# Patient Record
Sex: Male | Born: 1990
Health system: Southern US, Community
[De-identification: ages and names within clinical notes are randomized; demographics above are authoritative.]

## PROBLEM LIST (undated history)

## (undated) DIAGNOSIS — L02612 Cutaneous abscess of left foot: Secondary | ICD-10-CM

## (undated) DIAGNOSIS — F1910 Other psychoactive substance abuse, uncomplicated: Secondary | ICD-10-CM

## (undated) DIAGNOSIS — K759 Inflammatory liver disease, unspecified: Secondary | ICD-10-CM

## (undated) DIAGNOSIS — F1110 Opioid abuse, uncomplicated: Secondary | ICD-10-CM

## (undated) HISTORY — DX: Cutaneous abscess of left foot: L02.612

## (undated) HISTORY — PX: TONSILLECTOMY: SUR1361

---

## 2010-10-11 ENCOUNTER — Emergency Department (HOSPITAL_COMMUNITY)
Admission: EM | Admit: 2010-10-11 | Discharge: 2010-10-11 | Payer: Self-pay | Source: Home / Self Care | Admitting: Emergency Medicine

## 2011-10-03 ENCOUNTER — Encounter: Payer: Self-pay | Admitting: Emergency Medicine

## 2011-10-03 ENCOUNTER — Emergency Department (HOSPITAL_COMMUNITY)
Admission: EM | Admit: 2011-10-03 | Discharge: 2011-10-03 | Disposition: A | Discharge status: Home / Self Care | Payer: Self-pay | Attending: Emergency Medicine | Admitting: Emergency Medicine

## 2011-10-03 DIAGNOSIS — L02219 Cutaneous abscess of trunk, unspecified: Secondary | ICD-10-CM | POA: Insufficient documentation

## 2011-10-03 DIAGNOSIS — F17200 Nicotine dependence, unspecified, uncomplicated: Secondary | ICD-10-CM | POA: Insufficient documentation

## 2011-10-03 DIAGNOSIS — R222 Localized swelling, mass and lump, trunk: Secondary | ICD-10-CM | POA: Insufficient documentation

## 2011-10-03 DIAGNOSIS — R079 Chest pain, unspecified: Secondary | ICD-10-CM | POA: Insufficient documentation

## 2011-10-03 DIAGNOSIS — L02213 Cutaneous abscess of chest wall: Secondary | ICD-10-CM

## 2011-10-03 HISTORY — DX: Opioid abuse, uncomplicated: F11.10

## 2011-10-03 HISTORY — DX: Other psychoactive substance abuse, uncomplicated: F19.10

## 2011-10-03 MED ORDER — LIDOCAINE HCL 1 % IJ SOLN
INTRAMUSCULAR | Status: AC
  Filled 2011-10-03: qty 20

## 2011-10-03 MED ORDER — LIDOCAINE HCL (PF) 1 % IJ SOLN
20.0000 mL | Freq: Once | INTRAMUSCULAR | Status: AC
  Administered 2011-10-03: 20 mL via SUBCUTANEOUS

## 2011-10-03 MED ORDER — OXYCODONE-ACETAMINOPHEN 5-325 MG PO TABS
1.0000 | ORAL_TABLET | Freq: Once | ORAL | Status: AC
  Administered 2011-10-03: 1 via ORAL
  Filled 2011-10-03: qty 1

## 2011-10-03 MED ORDER — LIDOCAINE HCL (PF) 1 % IJ SOLN: 5.0000 mL | Freq: Once | INTRAMUSCULAR | Status: DC

## 2011-10-03 MED ORDER — HYDROCODONE-ACETAMINOPHEN 5-500 MG PO TABS: 1.0000 | ORAL_TABLET | Freq: Four times a day (QID) | ORAL | Status: AC | PRN

## 2011-10-03 MED ORDER — DOXYCYCLINE HYCLATE 100 MG PO CAPS: 100.0000 mg | ORAL_CAPSULE | Freq: Two times a day (BID) | ORAL | Status: AC

## 2011-10-03 NOTE — ED Provider Notes (Signed)
History     CSN: [id number] Arrival date & time: 10/03/2011  2:06 PM   First MD Initiated Contact with Patient 10/03/11 1620      Chief Complaint  Patient presents with  . Chest Pain  . Cyst     Patient is a 20 y.o. male presenting with chest pain and abscess. The history is provided by the patient.  Chest Pain Pertinent negatives for primary symptoms include no fever.    Abscess  This is a new problem. The current episode started less than one week ago. The onset was sudden. The problem occurs continuously. The problem is moderate. The abscess is characterized by painfulness, swelling and redness. The abscess first occurred at home. Pertinent negatives include no fever.  Reports onset of small re "bump" to center of upper chest wall 4 days ago. States she was seen at [facility] who did a CXR that just showed inflammation. Swelling, redness and pain have worsened.  Past Medical History  Diagnosis Date  . IV drug abuse   . Drug abuse, opioid type     Past Surgical History  Procedure Date  . Tonsillectomy     History reviewed. No pertinent family history.  History  Substance Use Topics  . Smoking status: Current Everyday Smoker -- 0.5 packs/day    Types: Cigarettes  . Smokeless tobacco: Not on file  . Alcohol Use: Yes      Review of Systems  Constitutional: Negative.  Negative for fever.  HENT: Negative.   Eyes: Negative.   Respiratory: Negative.   Cardiovascular: Positive for chest pain.  Gastrointestinal: Negative.   Genitourinary: Negative.   Musculoskeletal: Negative.   Skin: Negative.   Neurological: Negative.   Hematological: Negative.   Psychiatric/Behavioral: Negative.     Allergies  Review of patient's allergies indicates no known allergies.  Home Medications   Current Outpatient Rx  Name Route Sig Dispense Refill  . HYDROCODONE-ACETAMINOPHEN 5-325 MG PO TABS Oral Take 1 tablet by mouth every 6 (six) hours as needed. Chest pain     .  IBUPROFEN 200 MG PO TABS Oral Take 200 mg by mouth every 6 (six) hours as needed. Swelling and pain       BP 121/66  Pulse 96  Temp(Src) 98.5 F (36.9 C) (Oral)  Resp 16  SpO2 100%  Physical Exam  Constitutional: He appears well-developed and well-nourished.  HENT:  Head: Normocephalic and atraumatic.  Eyes: Conjunctivae are normal.  Neck: Neck supple.  Cardiovascular: Normal rate.   Pulmonary/Chest: Effort normal. He exhibits mass.         Approx 3 cm raised nodule to mid upper chest wall just above nipple line. Nodule is very erythematous and non-draining. There is an area of associated erythema at the periphery of the wound that measures approx 10-12 cm.   Musculoskeletal: Normal range of motion.  Neurological: He is alert.  Skin: Skin is warm and dry.  Psychiatric: He has a normal mood and affect.    ED Course  INCISION AND DRAINAGE Date/Time: 10/03/2011 6:04 PM Performed by: Leanne Chang Authorized by: Leanne Chang Consent: Verbal consent obtained. Risks and benefits: risks, benefits and alternatives were discussed Consent given by: patient Patient understanding: patient states understanding of the procedure being performed Required items: required blood products, implants, devices, and special equipment available Patient identity confirmed: verbally with patient and arm band Type: abscess Body area: trunk Anesthesia: local infiltration Local anesthetic: lidocaine 1% without epinephrine Anesthetic total: 3 ml Patient  sedated: no Scalpel size: 11 Incision type: single straight Complexity: simple Drainage: bloody Drainage amount: scant Wound treatment: drain placed Patient tolerance: Patient tolerated the procedure well with no immediate complications.  Impression dicsussed. Will d/c home on abx and encourage warm soaks and to return in 2 days for recheck. Pt agreeable w/ plan.   1. Abscess of chest wall       MDM  Chest wall  abscess.   Medical screening examination/treatment/procedure(s) were performed by non-physician practitioner and as supervising physician I was immediately available for consultation/collaboration. Osvaldo Human, M.D.      Leanne Chang, NP 10/03/11 1806  Carleene Cooper III, MD 10/04/11 [phone]

## 2011-10-03 NOTE — ED Notes (Signed)
Pt in c/o bump to sternal area of chest, states it was first noted 3 days ago and was a small red area, since that time has become larger, redness spreading down chest and causing pain in axillary area with movement, area is warm and tender to touch.

## 2011-10-03 NOTE — ED Notes (Signed)
Pt states had small area to center of chest upon waking, pt states it is getting bigger and more painful denies injury or bite. Denies drainage.

## 2011-10-06 ENCOUNTER — Emergency Department (HOSPITAL_COMMUNITY)
Admission: EM | Admit: 2011-10-06 | Discharge: 2011-10-06 | Disposition: A | Discharge status: Home / Self Care | Payer: Self-pay | Attending: Emergency Medicine | Admitting: Emergency Medicine

## 2011-10-06 ENCOUNTER — Encounter (HOSPITAL_COMMUNITY): Payer: Self-pay | Admitting: *Deleted

## 2011-10-06 DIAGNOSIS — Z09 Encounter for follow-up examination after completed treatment for conditions other than malignant neoplasm: Secondary | ICD-10-CM | POA: Insufficient documentation

## 2011-10-06 DIAGNOSIS — L0291 Cutaneous abscess, unspecified: Secondary | ICD-10-CM | POA: Insufficient documentation

## 2011-10-06 DIAGNOSIS — M436 Torticollis: Secondary | ICD-10-CM | POA: Insufficient documentation

## 2011-10-06 DIAGNOSIS — R079 Chest pain, unspecified: Secondary | ICD-10-CM | POA: Insufficient documentation

## 2011-10-06 DIAGNOSIS — L0390 Cellulitis, unspecified: Secondary | ICD-10-CM | POA: Insufficient documentation

## 2011-10-06 NOTE — ED Provider Notes (Signed)
History     CSN: [id number]  Arrival date & time 10/06/11  1321   First MD Initiated Contact with Patient 10/06/11 1514      Chief Complaint  Patient presents with  . Wound Check    pt has wound to chest from abscess drainage. pt reports increased drainage.     (Consider location/radiation/quality/duration/timing/severity/associated sxs/prior treatment) Patient is a 20 y.o. male presenting with abscess.  Abscess  This is a new problem. The problem has been gradually improving. The abscess is present on the torso. The problem is moderate. The abscess is characterized by redness, painfulness and draining. Pertinent negatives include no fever, no diarrhea, no vomiting, no congestion, no sore throat and no cough.   Patient seen the emergency partner 3 days ago with I&D of abscess had been present for several days after the I&D there was no expression of pus but it started draining spontaneously yesterday large amount of pus. Patient started taking his antibiotic doxycycline on Tuesday. Overall the patient says the abscess located in the substernal midportion of his chest is improving significantly less swelling less redness. It is still painful pain currently is about 4/10 worse with palpitation does not radiate that is improved from 10 out of 10 before the I&D.   Past Medical History  Diagnosis Date  . IV drug abuse   . Drug abuse, opioid type     Past Surgical History  Procedure Date  . Tonsillectomy     History reviewed. No pertinent family history.  History  Substance Use Topics  . Smoking status: Current Everyday Smoker -- 0.5 packs/day    Types: Cigarettes  . Smokeless tobacco: Not on file  . Alcohol Use: Yes      Review of Systems  Constitutional: Negative for fever.  HENT: Positive for neck stiffness. Negative for congestion, sore throat and neck pain.   Eyes: Negative for visual disturbance.  Respiratory: Positive for chest tightness. Negative for cough and  shortness of breath.   Cardiovascular: Positive for chest pain. Negative for leg swelling.  Gastrointestinal: Negative for vomiting, abdominal pain and diarrhea.  Genitourinary: Negative for dysuria.  Musculoskeletal: Negative for back pain.  Skin: Positive for wound. Negative for rash.  Neurological: Negative for headaches.  Hematological: Does not bruise/bleed easily.    Allergies  Review of patient's allergies indicates no known allergies.  Home Medications   Current Outpatient Rx  Name Route Sig Dispense Refill  . DOXYCYCLINE HYCLATE 100 MG PO CAPS Oral Take 1 capsule (100 mg total) by mouth 2 (two) times daily. 20 capsule 0  . HYDROCODONE-ACETAMINOPHEN 5-500 MG PO TABS Oral Take 1-2 tablets by mouth every 6 (six) hours as needed for pain. Every 4-6 hours as needed for pain 15 tablet 0  . IBUPROFEN 200 MG PO TABS Oral Take 200 mg by mouth every 6 (six) hours as needed. Swelling and pain       BP 128/82  Pulse 96  Temp(Src) 98.3 F (36.8 C) (Oral)  Resp 20  SpO2 100%  Physical Exam  Nursing note and vitals reviewed. Constitutional: He is oriented to person, place, and time. He appears well-developed and well-nourished.  HENT:  Head: Normocephalic and atraumatic.  Mouth/Throat: Oropharynx is clear and moist.  Eyes: Conjunctivae and EOM are normal. Pupils are equal, round, and reactive to light.  Neck: Normal range of motion. Neck supple.  Cardiovascular: Normal rate, regular rhythm and normal heart sounds.   No murmur heard. Pulmonary/Chest: Effort normal and breath sounds  normal.  Abdominal: Soft. Bowel sounds are normal. There is no tenderness.  Musculoskeletal: Normal range of motion.  Neurological: He is alert and oriented to person, place, and time. No cranial nerve deficit. He exhibits normal muscle tone. Coordination normal.  Skin: Skin is warm. There is erythema.       Draining abscess mid  Sternal area. 4 cm area of redness worsening area of induration draining  purulent material. Minimal swelling.     ED Course  Procedures (including critical care time)  Labs Reviewed - No data to display No results found.   1. Abscess       MDM   Midsternal skin abscess I&D on Monday, 3 days ago, initially according to know did not drain pus patient stated that the wound started draining pus yesterday. Continue drain today but less swelling is gone down redness is gone down. Examination of the wound shows still some expression of some purulent material of both overall based on initial description wound is improving. Patient's taking antibiotic doxycycline as directed. The wick that was placed in the wound fell out yesterday. Patient given further wound instructions were so wound daily with soap and water continue to dress return if the abscess gets worse at all she continued to improve slowly from this point on.        Shelda Jakes, MD 10/06/11 [phone]

## 2017-01-01 ENCOUNTER — Emergency Department (HOSPITAL_COMMUNITY): Payer: No Typology Code available for payment source

## 2017-01-01 ENCOUNTER — Encounter (HOSPITAL_COMMUNITY): Payer: Self-pay | Admitting: Emergency Medicine

## 2017-01-01 ENCOUNTER — Inpatient Hospital Stay (HOSPITAL_COMMUNITY)
Admission: EM | Admit: 2017-01-01 | Discharge: 2017-01-03 | DRG: 605 | Disposition: A | Discharge status: Home / Self Care | Payer: No Typology Code available for payment source | Attending: General Surgery | Admitting: General Surgery

## 2017-01-01 DIAGNOSIS — Z720 Tobacco use: Secondary | ICD-10-CM

## 2017-01-01 DIAGNOSIS — Y92009 Unspecified place in unspecified non-institutional (private) residence as the place of occurrence of the external cause: Secondary | ICD-10-CM

## 2017-01-01 DIAGNOSIS — R52 Pain, unspecified: Secondary | ICD-10-CM

## 2017-01-01 DIAGNOSIS — E860 Dehydration: Secondary | ICD-10-CM | POA: Diagnosis present

## 2017-01-01 DIAGNOSIS — F17210 Nicotine dependence, cigarettes, uncomplicated: Secondary | ICD-10-CM | POA: Diagnosis present

## 2017-01-01 DIAGNOSIS — E871 Hypo-osmolality and hyponatremia: Secondary | ICD-10-CM | POA: Diagnosis present

## 2017-01-01 DIAGNOSIS — D62 Acute posthemorrhagic anemia: Secondary | ICD-10-CM | POA: Diagnosis present

## 2017-01-01 DIAGNOSIS — S71112A Laceration without foreign body, left thigh, initial encounter: Secondary | ICD-10-CM | POA: Diagnosis not present

## 2017-01-01 DIAGNOSIS — Z79899 Other long term (current) drug therapy: Secondary | ICD-10-CM

## 2017-01-01 DIAGNOSIS — M79605 Pain in left leg: Secondary | ICD-10-CM | POA: Diagnosis not present

## 2017-01-01 DIAGNOSIS — F1910 Other psychoactive substance abuse, uncomplicated: Secondary | ICD-10-CM

## 2017-01-01 DIAGNOSIS — S7012XA Contusion of left thigh, initial encounter: Secondary | ICD-10-CM

## 2017-01-01 LAB — CBC WITH DIFFERENTIAL/PLATELET
BASOS ABS: 0 10*3/uL (ref 0.0–0.1)
BASOS PCT: 0 %
Eosinophils Absolute: 0.1 10*3/uL (ref 0.0–0.7)
Eosinophils Relative: 1 %
HEMATOCRIT: 34.3 % — AB (ref 39.0–52.0)
HEMOGLOBIN: 12 g/dL — AB (ref 13.0–17.0)
LYMPHS PCT: 17 %
Lymphs Abs: 1.8 10*3/uL (ref 0.7–4.0)
MCH: 30.5 pg (ref 26.0–34.0)
MCHC: 35 g/dL (ref 30.0–36.0)
MCV: 87.3 fL (ref 78.0–100.0)
Monocytes Absolute: 0.8 10*3/uL (ref 0.1–1.0)
Monocytes Relative: 7 %
NEUTROS ABS: 8.3 10*3/uL — AB (ref 1.7–7.7)
NEUTROS PCT: 75 %
PLATELETS: 233 10*3/uL (ref 150–400)
RBC: 3.93 MIL/uL — ABNORMAL LOW (ref 4.22–5.81)
RDW: 13.9 % (ref 11.5–15.5)
WBC: 11 10*3/uL — ABNORMAL HIGH (ref 4.0–10.5)

## 2017-01-01 LAB — BASIC METABOLIC PANEL
Anion gap: 9 (ref 5–15)
BUN: 17 mg/dL (ref 6–20)
CO2: 25 mmol/L (ref 22–32)
Calcium: 9 mg/dL (ref 8.9–10.3)
Chloride: 100 mmol/L — ABNORMAL LOW (ref 101–111)
Creatinine, Ser: 0.65 mg/dL (ref 0.61–1.24)
GFR calc Af Amer: >60 mL/min (ref >60)
GLUCOSE: 101 mg/dL — AB (ref 65–99)
Potassium: 3.5 mmol/L (ref 3.5–5.1)
SODIUM: 134 mmol/L — AB (ref 135–145)

## 2017-01-01 LAB — TYPE AND SCREEN
ABO/RH(D): B NEG
ANTIBODY SCREEN: NEGATIVE

## 2017-01-01 LAB — I-STAT CG4 LACTIC ACID, ED
LACTIC ACID, VENOUS: 0.58 mmol/L (ref 0.5–1.9)
Lactic Acid, Venous: 0.98 mmol/L (ref 0.5–1.9)

## 2017-01-01 MED ORDER — BISACODYL 10 MG RE SUPP
10.0000 mg | Freq: Two times a day (BID) | RECTAL | Status: DC | PRN
Start: 2017-01-01 — End: 2017-01-03

## 2017-01-01 MED ORDER — DIAZEPAM 5 MG PO TABS
5.0000 mg | ORAL_TABLET | Freq: Three times a day (TID) | ORAL | Status: DC | PRN
Start: 2017-01-01 — End: 2017-01-03

## 2017-01-01 MED ORDER — AMOXICILLIN-POT CLAVULANATE 875-125 MG PO TABS: 1.0000 | ORAL_TABLET | Freq: Two times a day (BID) | ORAL | 1 refills | Status: DC

## 2017-01-01 MED ORDER — MORPHINE SULFATE (PF) 4 MG/ML IV SOLN
4.0000 mg | Freq: Once | INTRAVENOUS | Status: AC
  Administered 2017-01-01: 4 mg via INTRAVENOUS
  Filled 2017-01-01: qty 1

## 2017-01-01 MED ORDER — HYDRALAZINE HCL 20 MG/ML IJ SOLN: 5.0000 mg | Freq: Four times a day (QID) | INTRAMUSCULAR | Status: DC | PRN

## 2017-01-01 MED ORDER — PIPERACILLIN-TAZOBACTAM 3.375 G IVPB 30 MIN
3.3750 g | Freq: Once | INTRAVENOUS | Status: AC
  Administered 2017-01-01: 3.375 g via INTRAVENOUS
  Filled 2017-01-01: qty 50

## 2017-01-01 MED ORDER — TETANUS-DIPHTH-ACELL PERTUSSIS 5-2.5-18.5 LF-MCG/0.5 IM SUSP
0.5000 mL | Freq: Once | INTRAMUSCULAR | Status: AC
  Administered 2017-01-01: 0.5 mL via INTRAMUSCULAR
  Filled 2017-01-01: qty 0.5

## 2017-01-01 MED ORDER — VANCOMYCIN HCL IN DEXTROSE 1-5 GM/200ML-% IV SOLN
1000.0000 mg | Freq: Three times a day (TID) | INTRAVENOUS | Status: DC
  Administered 2017-01-01 – 2017-01-03 (×5): 1000 mg via INTRAVENOUS
  Filled 2017-01-01 (×7): qty 200

## 2017-01-01 MED ORDER — PHENOL 1.4 % MT LIQD
2.0000 | OROMUCOSAL | Status: DC | PRN
  Filled 2017-01-01: qty 177

## 2017-01-01 MED ORDER — LIP MEDEX EX OINT
1.0000 "application " | TOPICAL_OINTMENT | Freq: Two times a day (BID) | CUTANEOUS | Status: DC
  Administered 2017-01-02 (×2): 1 via TOPICAL
  Filled 2017-01-01: qty 7

## 2017-01-01 MED ORDER — METOPROLOL TARTRATE 12.5 MG HALF TABLET: 12.5000 mg | ORAL_TABLET | Freq: Two times a day (BID) | ORAL | Status: DC | PRN

## 2017-01-01 MED ORDER — OXYCODONE HCL 5 MG PO TABS: 5.0000 mg | ORAL_TABLET | ORAL | Status: DC | PRN

## 2017-01-01 MED ORDER — METHOCARBAMOL 750 MG PO TABS: 750.0000 mg | ORAL_TABLET | Freq: Four times a day (QID) | ORAL | 2 refills | Status: DC | PRN

## 2017-01-01 MED ORDER — HYDROMORPHONE HCL 1 MG/ML IJ SOLN
1.0000 mg | Freq: Once | INTRAMUSCULAR | Status: AC
  Administered 2017-01-01: 1 mg via INTRAVENOUS
  Filled 2017-01-01: qty 1

## 2017-01-01 MED ORDER — IOPAMIDOL (ISOVUE-370) INJECTION 76%
INTRAVENOUS | Status: AC
  Administered 2017-01-01: 100 mL
  Filled 2017-01-01: qty 100

## 2017-01-01 MED ORDER — DIPHENHYDRAMINE HCL 50 MG/ML IJ SOLN: 12.5000 mg | Freq: Four times a day (QID) | INTRAMUSCULAR | Status: DC | PRN

## 2017-01-01 MED ORDER — LACTATED RINGERS IV BOLUS (SEPSIS): 1000.0000 mL | Freq: Three times a day (TID) | INTRAVENOUS | Status: DC | PRN

## 2017-01-01 MED ORDER — KCL IN DEXTROSE-NACL 40-5-0.45 MEQ/L-%-% IV SOLN
INTRAVENOUS | Status: DC
  Administered 2017-01-02 (×2): via INTRAVENOUS
  Filled 2017-01-01 (×5): qty 1000

## 2017-01-01 MED ORDER — SODIUM CHLORIDE 0.9 % IV BOLUS (SEPSIS)
1000.0000 mL | Freq: Once | INTRAVENOUS | Status: AC
  Administered 2017-01-01: 1000 mL via INTRAVENOUS

## 2017-01-01 MED ORDER — ACETAMINOPHEN 650 MG RE SUPP: 650.0000 mg | Freq: Four times a day (QID) | RECTAL | Status: DC | PRN

## 2017-01-01 MED ORDER — FENTANYL CITRATE (PF) 100 MCG/2ML IJ SOLN
25.0000 ug | INTRAMUSCULAR | Status: DC | PRN
  Administered 2017-01-02: 50 ug via INTRAVENOUS
  Filled 2017-01-01: qty 2

## 2017-01-01 MED ORDER — DIPHENHYDRAMINE HCL 25 MG PO CAPS: 25.0000 mg | ORAL_CAPSULE | Freq: Four times a day (QID) | ORAL | Status: DC | PRN

## 2017-01-01 MED ORDER — LIDOCAINE-EPINEPHRINE (PF) 2 %-1:200000 IJ SOLN
20.0000 mL | Freq: Once | INTRAMUSCULAR | Status: AC
  Administered 2017-01-01: 20 mL via INTRADERMAL
  Filled 2017-01-01: qty 20

## 2017-01-01 MED ORDER — PIPERACILLIN-TAZOBACTAM 3.375 G IVPB
3.3750 g | Freq: Three times a day (TID) | INTRAVENOUS | Status: DC
  Administered 2017-01-02 – 2017-01-03 (×4): 3.375 g via INTRAVENOUS
  Filled 2017-01-01 (×6): qty 50

## 2017-01-01 MED ORDER — MENTHOL 3 MG MT LOZG
1.0000 | LOZENGE | OROMUCOSAL | Status: DC | PRN
  Filled 2017-01-01: qty 9

## 2017-01-01 MED ORDER — NAPROXEN 500 MG PO TABS: 500.0000 mg | ORAL_TABLET | Freq: Two times a day (BID) | ORAL | 1 refills | Status: DC

## 2017-01-01 MED ORDER — ONDANSETRON HCL 4 MG/2ML IJ SOLN: 4.0000 mg | Freq: Four times a day (QID) | INTRAMUSCULAR | Status: DC | PRN

## 2017-01-01 MED ORDER — NICOTINE 21 MG/24HR TD PT24
21.0000 mg | MEDICATED_PATCH | Freq: Once | TRANSDERMAL | Status: AC
  Administered 2017-01-01: 21 mg via TRANSDERMAL
  Filled 2017-01-01: qty 1

## 2017-01-01 MED ORDER — METHOCARBAMOL 500 MG PO TABS
1000.0000 mg | ORAL_TABLET | Freq: Four times a day (QID) | ORAL | Status: DC
  Administered 2017-01-02 – 2017-01-03 (×6): 1000 mg via ORAL
  Filled 2017-01-01 (×6): qty 2

## 2017-01-01 MED ORDER — METOPROLOL TARTRATE 5 MG/5ML IV SOLN: 5.0000 mg | Freq: Four times a day (QID) | INTRAVENOUS | Status: DC | PRN

## 2017-01-01 MED ORDER — NAPROXEN 250 MG PO TABS
500.0000 mg | ORAL_TABLET | Freq: Two times a day (BID) | ORAL | Status: DC
  Administered 2017-01-02 – 2017-01-03 (×3): 500 mg via ORAL
  Filled 2017-01-01 (×3): qty 2

## 2017-01-01 MED ORDER — LACTATED RINGERS IV SOLN: INTRAVENOUS | Status: DC

## 2017-01-01 MED ORDER — ALUM & MAG HYDROXIDE-SIMETH 200-200-20 MG/5ML PO SUSP: 30.0000 mL | Freq: Four times a day (QID) | ORAL | Status: DC | PRN

## 2017-01-01 MED ORDER — PROCHLORPERAZINE EDISYLATE 5 MG/ML IJ SOLN: 5.0000 mg | INTRAMUSCULAR | Status: DC | PRN

## 2017-01-01 MED ORDER — ACETAMINOPHEN 325 MG PO TABS: 325.0000 mg | ORAL_TABLET | Freq: Four times a day (QID) | ORAL | Status: DC | PRN

## 2017-01-01 MED ORDER — MAGIC MOUTHWASH
15.0000 mL | Freq: Four times a day (QID) | ORAL | Status: DC | PRN
  Filled 2017-01-01: qty 15

## 2017-01-01 MED ORDER — SODIUM CHLORIDE 0.9 % IV SOLN
8.0000 mg | Freq: Four times a day (QID) | INTRAVENOUS | Status: DC | PRN
  Filled 2017-01-01: qty 4

## 2017-01-01 MED ORDER — POLYETHYLENE GLYCOL 3350 17 G PO PACK
17.0000 g | PACK | Freq: Two times a day (BID) | ORAL | Status: DC | PRN
  Administered 2017-01-02: 17 g via ORAL
  Filled 2017-01-01: qty 1

## 2017-01-01 MED ORDER — ONDANSETRON HCL 4 MG/2ML IJ SOLN
4.0000 mg | Freq: Once | INTRAMUSCULAR | Status: AC
Start: 2017-01-01 — End: 2017-01-01
  Administered 2017-01-01: 4 mg via INTRAVENOUS
  Filled 2017-01-01: qty 2

## 2017-01-01 MED ORDER — LACTATED RINGERS IV BOLUS (SEPSIS)
1000.0000 mL | Freq: Once | INTRAVENOUS | Status: AC
Start: 2017-01-01 — End: 2017-01-02
  Administered 2017-01-01: 1000 mL via INTRAVENOUS
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
## 2017-01-01 NOTE — ED Notes (Signed)
DR. Penne LashISSACS MADE AWARE OF THE DIFFICULT BLOOD DRAW, AND ONLY 1 SET OF BLOOD CULTURES OBTAINED.

## 2017-01-01 NOTE — ED Notes (Signed)
XXX password is [phone]
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
## 2017-01-01 NOTE — ED Notes (Signed)
Bed: [facility]16 Expected date:  Expected time:  Means of arrival:  Comments: HOLD FOR RES A 

## 2017-01-01 NOTE — Discharge Instructions (Signed)
WOUND CARE  It is important that the wound be kept open.   -Keeping the skin edges apart will allow the wound to gradually heal from the base upwards.   - If the skin edges of the wound close too early, a new fluid pocket can form and infection can occur. -This is the reason to pack deeper wounds with gauze or ribbon -This is why drained wounds cannot be sewed closed right away  A healthy wound should form a lining of bright red "beefy" granulating tissue that will help shrink the wound and help the edges grow new skin into it.   -A little mucus / yellow discharge is normal (the body's natural way to try and form a scab) and should be gently washed off with soap and water with daily dressing changes.  -Green or foul smelling drainage implies bacterial colonization and can slow wound healing - a short course of antibiotic ointment (3-5 days) can help it clear up.  Call the doctor if it does not improve or worsens  -Avoid use of antibiotic ointments for more than a week as they can slow wound healing over time.    -Sometimes other wound care products will be used to reduce need for dressing changes and/or help clean up dirty wounds -Sometimes the surgeon needs to debride the wound in the office to remove dead or infected tissue out of the wound so it can heal more quickly and safely.    Change the dressing at least once a day -Wash the wound with mild soap and water gently every day.  It is good to shower or bathe the wound to help it clean out. -Use clean 4x4 gauze for medium/large wounds or ribbon plain NU-gauze for smaller wounds (it does not need to be sterile, just clean) -Keep the raw wound moist with a little saline or KY (saline) gel on the gauze.  -A dry wound will take longer to heal.  -Keep the skin dry around the wound to prevent breakdown and irritation. -Pack the wound down to the base -The goal is to keep the skin apart, not overpack the wound -Use a Q-tip or blunt-tipped kabob  stick toothpick to push the gauze down to the base in narrow or deep wounds   -Cover with a clean gauze and tape -paper or Medipore tape tend to be gentle on the skin -rotate the orientation of the tape to avoid repeated stress/trauma on the skin -using an ACE or Coban wrap on wounds on arms or legs can be used instead.  Complete all antibiotics through the entire prescription to help the infection heal and prevent new places of infection   Returning the see the surgeon is helpful to follow the healing process and help the wound close as fast as possible.    Laceration Care, Adult A laceration is a cut that goes through all layers of the skin. The cut also goes into the tissue that is right under the skin. Some cuts heal on their own. Others need to be closed with stitches (sutures), staples, skin adhesive strips, or wound glue. Taking care of your cut lowers your risk of infection and helps your cut to heal better. How to take care of your cut General Instructions   To help prevent scarring, make sure to cover your wound with sunscreen whenever you are outside after stitches are removed, after adhesive strips are removed, or when wound glue stays in place and the wound is healed. Make sure to  wear a sunscreen of at least 30 SPF.  Take over-the-counter and prescription medicines only as told by your doctor.  If you were given antibiotic medicine or ointment, take or apply it as told by your doctor. Do not stop using the antibiotic even if your wound is getting better.  Do not scratch or pick at the wound.  Keep all follow-up visits as told by your doctor. This is important.  Check your wound every day for signs of infection. Watch for:  Redness, swelling, or pain.  Fluid, blood, or pus.  Raise (elevate) the injured area above the level of your heart while you are sitting or lying down, if possible. Get help if:  You got a tetanus shot and you have any of these problems at the  injection site:  Swelling.  Very bad pain.  Redness.  Bleeding.  You have a fever.  A wound that was closed breaks open.  You notice a bad smell coming from your wound or your bandage.  You notice something coming out of the wound, such as wood or glass.  Medicine does not help your pain.  You have more redness, swelling, or pain at the site of your wound.  You have fluid, blood, or pus coming from your wound.  You notice a change in the color of your skin near your wound.  You need to change the bandage often because fluid, blood, or pus is coming from the wound.  You start to have a new rash.  You start to have numbness around the wound. Get help right away if:  You have very bad swelling around the wound.  Your pain suddenly gets worse and is very bad.  You notice painful lumps near the wound or on skin that is anywhere on your body.  You have a red streak going away from your wound.  The wound is on your hand or foot and you cannot move a finger or toe like you usually can.  The wound is on your hand or foot and you notice that your fingers or toes look pale or bluish. This information is not intended to replace advice given to you by your health care provider. Make sure you discuss any questions you have with your health care provider. Document Released: 03/21/2008 Document Revised: 03/10/2016 Document Reviewed: 09/29/2014 Elsevier Interactive Patient Education  2017 [employer]   Hematoma A hematoma is a collection of blood. The collection of blood can turn into a hard, painful lump under the skin. Your skin may turn blue or yellow if the hematoma is close to the surface of the skin. Most hematomas get better in a few days to weeks. Some hematomas are serious and need medical care. Hematomas can be very small or very big. Follow these instructions at home:  Apply ice to the injured area:  Put ice in a plastic bag.  Place a towel between your skin and  the bag.  Leave the ice on for 20 minutes, 2-3 times a day for the first 1 to 2 days.  After the first 2 days, switch to using warm packs on the injured area.  Raise (elevate) the injured area to lessen pain and puffiness (swelling). You may also wrap the area with an elastic bandage. Make sure the bandage is not wrapped too tight.  If you have a painful hematoma on your leg or foot, you may use crutches for a couple days.  Only take medicines as told by your doctor.  Get help right away if:  Your pain gets worse.  Your pain is not controlled with medicine.  You have a fever.  Your puffiness gets worse.  Your skin turns more blue or yellow.  Your skin over the hematoma breaks or starts bleeding.  Your hematoma is in your chest or belly (abdomen) and you are short of breath, feel weak, or have a change in consciousness.  Your hematoma is on your scalp and you have a headache that gets worse or a change in alertness or consciousness. This information is not intended to replace advice given to you by your health care provider. Make sure you discuss any questions you have with your health care provider. Document Released: 11/10/2004 Document Revised: 03/10/2016 Document Reviewed: 03/13/2013 Elsevier Interactive Patient Education  2017 Else[employer] Inc.  Managing Pain  ######################################################################   CONTROL PAIN Control pain so that you can walk, sleep, tolerate sneezing/coughing, go up/down stairs.  (Good pain control is not pain free only when lying still, unable to move)  WALK Walk an hour a day.  Control your pain to do that.   HAVE A BOWEL MOVEMENT DAILY Keep your bowels regular to avoid problems.  OK to try a laxative to override constipation.  OK to use an antidairrheal to slow down diarrhea.  Call if not better after 2 tries  CALL IF YOU HAVE PROBLEMS/CONCERNS Call if you are still struggling despite following these  instructions. Call if you have concerns not answered by these instructions  ######################################################################    Pain after surgery or related to activity is often due to strain/injury to muscle, tendon, nerves and/or incisions.  This pain is usually short-term and will improve in a few months.   Many people find it helpful to do the following things TOGETHER to help speed the process of healing and to get back to regular activity more quickly:  1. Avoid heavy physical activity at first a. No lifting greater than 20 pounds at first, then increase to lifting as tolerated over the next few weeks b. Do not push through the pain.  Listen to your body and avoid positions and maneuvers than reproduce the pain.  Wait a few days before trying something more intense c. Walking is okay as tolerated, but go slowly and stop when getting sore.  If you can walk 30 minutes without stopping or pain, you can try more intense activity (running, jogging, aerobics, cycling, swimming, treadmill, sex, sports, weightlifting, etc ) d. Remember: If it hurts to do it, then dont do it!  2. Take Anti-inflammatory medication a. Choose ONE of the following over-the-counter medications: i.            Acetaminophen 500mg  tabs (Tylenol) 1-2 pills with every meal and just before bedtime (avoid if you have liver problems) ii.            Naproxen 220mg  tabs (ex. Aleve) 1-2 pills twice a day (avoid if you have kidney, stomach, IBD, or bleeding problems) iii. Ibuprofen 200mg  tabs (ex. Advil, Motrin) 3-4 pills with every meal and just before bedtime (avoid if you have kidney, stomach, IBD, or bleeding problems) b. Take with food/snack around the clock for 1-2 weeks i. This helps the muscle and nerve tissues become less irritable and calm down faster  3. Use a Heating pad or Ice/Cold Pack a. 4-6 times a day b. May use warm bath/hottub  or showers  4. Try Gentle Massage and/or Stretching   a. at the area of pain many  times a day b. stop if you feel pain - do not overdo it  Try these steps together to help you body heal faster and avoid making things get worse.  Doing just one of these things may not be enough.    If you are not getting better after two weeks or are noticing you are getting worse, contact our office for further advice; we may need to re-evaluate you & see what other things we can do to help.  STOP SMOKING!  We strongly recommend that you stop smoking.  Smoking increases the risk of surgery including infection in the form of an open wound, pus formation, abscess, hernia at an incision on the abdomen, etc.  You have an increased risk of other MAJOR complications such as stroke, heart attack, forming clots in the leg and/or lungs, and death.    Smoking Cessation Quitting smoking is important to your health and has many advantages. However, it is not always easy to quit since nicotine is a very addictive drug. Often times, people try 3 times or more before being able to quit. This document explains the best ways for you to prepare to quit smoking. Quitting takes hard work and a lot of effort, but you can do it. ADVANTAGES OF QUITTING SMOKING  You will live longer, feel better, and live better.  Your body will feel the impact of quitting smoking almost immediately.  Within 20 minutes, blood pressure decreases. Your pulse returns to its normal level.  After 8 hours, carbon monoxide levels in the blood return to normal. Your oxygen level increases.  After 24 hours, the chance of having a heart attack starts to decrease. Your breath, hair, and body stop smelling like smoke.  After 48 hours, damaged nerve endings begin to recover. Your sense of taste and smell improve.  After 72 hours, the body is virtually free of nicotine. Your bronchial tubes relax and breathing becomes easier.  After 2 to 12 weeks, lungs can hold more air. Exercise becomes easier and  circulation improves.  The risk of having a heart attack, stroke, cancer, or lung disease is greatly reduced.  After 1 year, the risk of coronary heart disease is cut in half.  After 5 years, the risk of stroke falls to the same as a nonsmoker.  After 10 years, the risk of lung cancer is cut in half and the risk of other cancers decreases significantly.  After 15 years, the risk of coronary heart disease drops, usually to the level of a nonsmoker.  If you are pregnant, quitting smoking will improve your chances of having a healthy baby.  The people you live with, especially any children, will be healthier.  You will have extra money to spend on things other than cigarettes. QUESTIONS TO THINK ABOUT BEFORE ATTEMPTING TO QUIT You may want to talk about your answers with your caregiver.  Why do you want to quit?  If you tried to quit in the past, what helped and what did not?  What will be the most difficult situations for you after you quit? How will you plan to handle them?  Who can help you through the tough times? Your family? Friends? A caregiver?  What pleasures do you get from smoking? What ways can you still get pleasure if you quit? Here are some questions to ask your caregiver:  How can you help me to be successful at quitting?  What medicine do you think would be best for me and how  should I take it?  What should I do if I need more help?  What is smoking withdrawal like? How can I get information on withdrawal? GET READY  Set a quit date.  Change your environment by getting rid of all cigarettes, ashtrays, matches, and lighters in your home, car, or work. Do not let people smoke in your home.  Review your past attempts to quit. Think about what worked and what did not. GET SUPPORT AND ENCOURAGEMENT You have a better chance of being successful if you have help. You can get support in many ways.  Tell your family, friends, and co-workers that you are going to  quit and need their support. Ask them not to smoke around you.  Get individual, group, or telephone counseling and support. Programs are available at [employer] and health centers. Call your local health department for information about programs in your area.  Spiritual beliefs and practices may help some smokers quit.  Download a "quit meter" on your computer to keep track of quit statistics, such as how long you have gone without smoking, cigarettes not smoked, and money saved.  Get a self-help book about quitting smoking and staying off of tobacco. LEARN NEW SKILLS AND BEHAVIORS  Distract yourself from urges to smoke. Talk to someone, go for a walk, or occupy your time with a task.  Change your normal routine. Take a different route to work. Drink tea instead of coffee. Eat breakfast in a different place.  Reduce your stress. Take a hot bath, exercise, or read a book.  Plan something enjoyable to do every day. Reward yourself for not smoking.  Explore interactive web-based programs that specialize in helping you quit. GET MEDICINE AND USE IT CORRECTLY Medicines can help you stop smoking and decrease the urge to smoke. Combining medicine with the above behavioral methods and support can greatly increase your chances of successfully quitting smoking.  Nicotine replacement therapy helps deliver nicotine to your body without the negative effects and risks of smoking. Nicotine replacement therapy includes nicotine gum, lozenges, inhalers, nasal sprays, and skin patches. Some may be available over-the-counter and others require a prescription.  Antidepressant medicine helps people abstain from smoking, but how this works is unknown. This medicine is available by prescription.  Nicotinic receptor partial agonist medicine simulates the effect of nicotine in your brain. This medicine is available by prescription. Ask your caregiver for advice about which medicines to use and how to use  them based on your health history. Your caregiver will tell you what side effects to look out for if you choose to be on a medicine or therapy. Carefully read the information on the package. Do not use any other product containing nicotine while using a nicotine replacement product.  RELAPSE OR DIFFICULT SITUATIONS Most relapses occur within the first 3 months after quitting. Do not be discouraged if you start smoking again. Remember, most people try several times before finally quitting. You may have symptoms of withdrawal because your body is used to nicotine. You may crave cigarettes, be irritable, feel very hungry, cough often, get headaches, or have difficulty concentrating. The withdrawal symptoms are only temporary. They are strongest when you first quit, but they will go away within 10 14 days. To reduce the chances of relapse, try to:  Avoid drinking alcohol. Drinking lowers your chances of successfully quitting.  Reduce the amount of caffeine you consume. Once you quit smoking, the amount of caffeine in your body increases and can give  you symptoms, such as a rapid heartbeat, sweating, and anxiety.  Avoid smokers because they can make you want to smoke.  Do not let weight gain distract you. Many smokers will gain weight when they quit, usually less than 10 pounds. Eat a healthy diet and stay active. You can always lose the weight gained after you quit.  Find ways to improve your mood other than smoking. FOR MORE INFORMATION  www.smokefree.gov    While it can be one of the most difficult things to do, the [place] community has programs to help you stop.  Consider talking with your primary care physician about options.  Also, Smoking Cessation classes are available through the [facility]:  The smoking cessation program is a proven-effective program from the American Lung Association. The program is available for anyone 65 and older who currently smokes. The program lasts for 7 weeks and is  8 sessions. Each class will be approximately 1 1/2 hours. The program is every Tuesday.  All classes are 12-1:30pm and same location.  Event Location Information:  Location: [facility] 2nd Floor Conference Room 2-037; located next to [facility] cross streets: [facility] Entrance into the [facility] is adjacent to the [facility] main entrance. The conference room is located on the 2nd floor.  Parking Instructions: Visitor parking is adjacent to [employer] main entrance and the Cancer Center    A smoking cessation program is also offered through the [facility]. Register online at [email or web address] or call [phone] for more information.   Tobacco cessation counseling is available at [facility]. Call [phone] for a free appointment.   Tobacco cessation classes also are available through the [facility] in [place]. For information, call [phone].   The Patient Education Network features videos on tobacco cessation. Please consult your listings in the center of this book to find instructions on how to access this resource.   If you want more information, ask your nurse.

## 2017-01-02 DIAGNOSIS — S71112A Laceration without foreign body, left thigh, initial encounter: Secondary | ICD-10-CM | POA: Diagnosis present

## 2017-01-02 DIAGNOSIS — D62 Acute posthemorrhagic anemia: Secondary | ICD-10-CM | POA: Diagnosis present

## 2017-01-02 DIAGNOSIS — Z79899 Other long term (current) drug therapy: Secondary | ICD-10-CM | POA: Diagnosis not present

## 2017-01-02 DIAGNOSIS — F1910 Other psychoactive substance abuse, uncomplicated: Secondary | ICD-10-CM | POA: Diagnosis present

## 2017-01-02 DIAGNOSIS — Y92009 Unspecified place in unspecified non-institutional (private) residence as the place of occurrence of the external cause: Secondary | ICD-10-CM | POA: Diagnosis not present

## 2017-01-02 DIAGNOSIS — M79605 Pain in left leg: Secondary | ICD-10-CM | POA: Diagnosis present

## 2017-01-02 DIAGNOSIS — F17210 Nicotine dependence, cigarettes, uncomplicated: Secondary | ICD-10-CM | POA: Diagnosis present

## 2017-01-02 DIAGNOSIS — E871 Hypo-osmolality and hyponatremia: Secondary | ICD-10-CM | POA: Diagnosis present

## 2017-01-02 DIAGNOSIS — E860 Dehydration: Secondary | ICD-10-CM | POA: Diagnosis present

## 2017-01-02 LAB — CBC
HCT: 28.8 % — ABNORMAL LOW (ref 39.0–52.0)
HEMATOCRIT: 32.9 % — AB (ref 39.0–52.0)
HEMOGLOBIN: 9.6 g/dL — AB (ref 13.0–17.0)
HEMOGLOBIN: 11.1 g/dL — AB (ref 13.0–17.0)
MCH: 30.2 pg (ref 26.0–34.0)
MCH: 30.9 pg (ref 26.0–34.0)
MCHC: 33.3 g/dL (ref 30.0–36.0)
MCHC: 33.7 g/dL (ref 30.0–36.0)
MCV: 90.6 fL (ref 78.0–100.0)
MCV: 91.6 fL (ref 78.0–100.0)
PLATELETS: 186 10*3/uL (ref 150–400)
Platelets: 165 10*3/uL (ref 150–400)
RBC: 3.18 MIL/uL — AB (ref 4.22–5.81)
RBC: 3.59 MIL/uL — AB (ref 4.22–5.81)
RDW: 14.1 % (ref 11.5–15.5)
RDW: 14.4 % (ref 11.5–15.5)
WBC: 6.2 10*3/uL (ref 4.0–10.5)
WBC: 6.4 10*3/uL (ref 4.0–10.5)

## 2017-01-02 LAB — ABO/RH: ABO/RH(D): B NEG

## 2017-01-02 MED ORDER — ACETAMINOPHEN 325 MG PO TABS
650.0000 mg | ORAL_TABLET | Freq: Four times a day (QID) | ORAL | Status: DC
  Administered 2017-01-02 – 2017-01-03 (×4): 650 mg via ORAL
  Filled 2017-01-02 (×4): qty 2
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
## 2017-01-02 NOTE — ED Notes (Signed)
Pt unable to urinate.

## 2017-01-02 NOTE — ED Notes (Signed)
[facility] at bedside to [facility].
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
## 2017-01-03 LAB — CREATININE, SERUM
CREATININE: 0.66 mg/dL (ref 0.61–1.24)
GFR calc Af Amer: >60 mL/min (ref >60)

## 2017-01-03 LAB — RAPID URINE DRUG SCREEN, HOSP PERFORMED
Amphetamines: POSITIVE — AB
Barbiturates: NOT DETECTED
Benzodiazepines: NOT DETECTED
COCAINE: NOT DETECTED
OPIATES: POSITIVE — AB
Tetrahydrocannabinol: NOT DETECTED

## 2017-01-03 MED ORDER — ACETAMINOPHEN 325 MG PO TABS: 650.0000 mg | ORAL_TABLET | Freq: Four times a day (QID) | ORAL | Status: DC

## 2017-01-03 MED ORDER — AMOXICILLIN-POT CLAVULANATE 875-125 MG PO TABS: 1.0000 | ORAL_TABLET | Freq: Two times a day (BID) | ORAL | 0 refills | Status: AC

## 2017-01-03 NOTE — Progress Notes (Signed)
Tanner Hoffman to be D/C'd  per MD order. Discussed with the patient and all questions fully answered.  VSS, Skin clean, dry and intact without evidence of skin break down, no evidence of skin tears noted.  IV catheter discontinued intact. Site without signs and symptoms of complications. Dressing and pressure applied.  An After Visit Summary was printed and given to the patient. Patient received prescription.  D/c education completed with patient/family including follow up instructions, medication list, d/c activities limitations if indicated, with other d/c instructions as indicated by MD - patient able to verbalize understanding, all questions fully answered.   Patient instructed to return to ED, call 911, or call MD for any changes in condition.   Patient to be escorted via WC, and D/C home via private auto.
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
## 2017-01-06 LAB — CULTURE, BLOOD (ROUTINE X 2): CULTURE: NO GROWTH

## 2017-01-09 ENCOUNTER — Telehealth (HOSPITAL_COMMUNITY): Payer: Self-pay

## 2017-01-09 NOTE — Telephone Encounter (Signed)
[phone] [facility] called to state Melina Schoolsrchie Maclellan keeps getting referred back to them when calling the Trauma office for pain medication.  Please call pt. for refill of pain meds.

## 2017-01-10 NOTE — Telephone Encounter (Signed)
Sent patient in a prescription for Robaxin 750 mg q 8h PRN #15 tablets (0 refills) to [facility] in [place], [place]. Should be available for pickup by 2:30 PM today 01/10/17. His follow up appointment is 01/19/17.

## 2017-01-10 NOTE — Telephone Encounter (Signed)
I personally reviewed his history on  substance database - last prescribed suboxone in early February. Patient reports that he was not accepted into the suboxone clinic this month after hospital discharge.

## 2017-01-15 DIAGNOSIS — B182 Chronic viral hepatitis C: Secondary | ICD-10-CM | POA: Insufficient documentation
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
## 2018-05-17 NOTE — Congregational Nurse Program (Signed)
Congregational Nurse Program Note  Date of Encounter: 05/02/2018  Past Medical History: Past Medical History:  Diagnosis Date  . Drug abuse, opioid type   . IV drug abuse     Encounter Details: CNP Questionnaire - 05/02/18 1227      Questionnaire   Patient Status  Not Applicable    Race  White or Caucasian    Location [facility]  [facility]    Insurance  Not Applicable    Uninsured  Uninsured (NEW 1x/quarter)    Food  No food insecurities    Housing/Utilities  Yes, have permanent housing    Transportation  No transportation needs    Interpersonal Safety  Yes, feel physically and emotionally safe where you currently live    Medication  Yes, have medication insecurities    Medical Provider  Yes    Referrals  Not Applicable    ED Visit Averted  Not Applicable    Life-Saving Intervention Made  Not Applicable      no health concerns this visit

## 2018-06-23 NOTE — Congregational Nurse Program (Signed)
Reports episodes of neurological pain right arm.  States has been dx with Right Ulnar Nerve Palsy.  Encouraged to f/u with neurologist

## 2018-08-20 NOTE — Congregational Nurse Program (Signed)
No health concerns voiced.  Flu immunization given

## 2018-08-20 NOTE — Congregational Nurse Program (Signed)
No health concerns voiced  

## 2018-12-18 ENCOUNTER — Emergency Department (HOSPITAL_COMMUNITY)
Admission: EM | Admit: 2018-12-18 | Discharge: 2018-12-18 | Disposition: A | Discharge status: Home / Self Care | Payer: No Typology Code available for payment source | Attending: Emergency Medicine | Admitting: Emergency Medicine

## 2018-12-18 ENCOUNTER — Other Ambulatory Visit: Payer: Self-pay

## 2018-12-18 ENCOUNTER — Encounter (HOSPITAL_COMMUNITY): Payer: Self-pay

## 2018-12-18 DIAGNOSIS — M79671 Pain in right foot: Secondary | ICD-10-CM | POA: Insufficient documentation

## 2018-12-18 DIAGNOSIS — Z5321 Procedure and treatment not carried out due to patient leaving prior to being seen by health care provider: Secondary | ICD-10-CM | POA: Insufficient documentation

## 2018-12-18 LAB — COMPREHENSIVE METABOLIC PANEL
ALBUMIN: 4.8 g/dL (ref 3.5–5.0)
ALT: 152 U/L — AB (ref 0–44)
AST: 459 U/L — AB (ref 15–41)
Alkaline Phosphatase: 89 U/L (ref 38–126)
Anion gap: 15 (ref 5–15)
BUN: 23 mg/dL — ABNORMAL HIGH (ref 6–20)
CHLORIDE: 101 mmol/L (ref 98–111)
CO2: 21 mmol/L — AB (ref 22–32)
CREATININE: 0.83 mg/dL (ref 0.61–1.24)
Calcium: 9.6 mg/dL (ref 8.9–10.3)
GFR calc Af Amer: >60 mL/min (ref >60)
GFR calc non Af Amer: >60 mL/min (ref >60)
GLUCOSE: 79 mg/dL (ref 70–99)
POTASSIUM: 3.6 mmol/L (ref 3.5–5.1)
Sodium: 137 mmol/L (ref 135–145)
Total Bilirubin: 2.6 mg/dL — ABNORMAL HIGH (ref 0.3–1.2)
Total Protein: 8 g/dL (ref 6.5–8.1)

## 2018-12-18 LAB — CBC WITH DIFFERENTIAL/PLATELET
ABS IMMATURE GRANULOCYTES: 0.02 10*3/uL (ref 0.00–0.07)
Basophils Absolute: 0 10*3/uL (ref 0.0–0.1)
Basophils Relative: 0 %
Eosinophils Absolute: 0.1 10*3/uL (ref 0.0–0.5)
Eosinophils Relative: 1 %
HEMATOCRIT: 44.8 % (ref 39.0–52.0)
HEMOGLOBIN: 15.4 g/dL (ref 13.0–17.0)
Immature Granulocytes: 0 %
LYMPHS ABS: 2.5 10*3/uL (ref 0.7–4.0)
LYMPHS PCT: 27 %
MCH: 31.8 pg (ref 26.0–34.0)
MCHC: 34.4 g/dL (ref 30.0–36.0)
MCV: 92.4 fL (ref 80.0–100.0)
MONO ABS: 1.2 10*3/uL — AB (ref 0.1–1.0)
MONOS PCT: 13 %
NEUTROS ABS: 5.6 10*3/uL (ref 1.7–7.7)
Neutrophils Relative %: 59 %
Platelets: 187 10*3/uL (ref 150–400)
RBC: 4.85 MIL/uL (ref 4.22–5.81)
RDW: 12.8 % (ref 11.5–15.5)
WBC: 9.4 10*3/uL (ref 4.0–10.5)
nRBC: 0 % (ref 0.0–0.2)

## 2018-12-18 LAB — LACTIC ACID, PLASMA: LACTIC ACID, VENOUS: 2.6 mmol/L — AB (ref 0.5–1.9)

## 2018-12-18 MED ORDER — SODIUM CHLORIDE 0.9% FLUSH: 3.0000 mL | Freq: Once | INTRAVENOUS | Status: DC

## 2018-12-18 NOTE — ED Triage Notes (Signed)
Pt reports right foot pain for about a month. Pt is IV drug user and states he hasnt shot up in that foot in 30 days. Swelling noted to foot but no redness.

## 2018-12-18 NOTE — ED Notes (Signed)
Pt states that he is leaving due to wait times  

## 2019-03-12 ENCOUNTER — Encounter: Payer: Self-pay | Admitting: Internal Medicine

## 2019-03-12 ENCOUNTER — Telehealth: Payer: Self-pay | Admitting: Pharmacy Technician

## 2019-03-12 NOTE — Telephone Encounter (Signed)
[facility] Patient Advocate Encounter ° °Insurance verification completed.   ° °The patient is uninsured and will need patient assistance for medication. ° °We can complete the application and will need to meet with the patient for signatures and income documentation. ° °Kaion Tisdale E. Calie Buttrey, CPhT °Specialty Pharmacy Patient Advocate °[facility] °Phone: [phone] °Fax:  [phone] ° ° °

## 2019-04-04 ENCOUNTER — Ambulatory Visit (INDEPENDENT_AMBULATORY_CARE_PROVIDER_SITE_OTHER): Payer: Self-pay | Admitting: Infectious Diseases

## 2019-04-04 ENCOUNTER — Other Ambulatory Visit: Payer: Self-pay

## 2019-04-04 ENCOUNTER — Encounter: Payer: Self-pay | Admitting: Infectious Diseases

## 2019-04-04 VITALS — BP 131/79 | HR 97 | Wt 134.0 lb

## 2019-04-04 DIAGNOSIS — F1010 Alcohol abuse, uncomplicated: Secondary | ICD-10-CM

## 2019-04-04 DIAGNOSIS — B182 Chronic viral hepatitis C: Secondary | ICD-10-CM

## 2019-04-04 DIAGNOSIS — F1990 Other psychoactive substance use, unspecified, uncomplicated: Secondary | ICD-10-CM

## 2019-04-04 NOTE — Progress Notes (Signed)
Tanner Hoffman  [id number]  May 06, 1991    HPI: The patient is a 28 y.o. y/o Not Hispanic or Latino male who presents today for an evaluation for HCV.  The patient was initially tested for hepatitis C and found to have a positive Ab on February 06, 2017 [place] [facility].  At that time he had a extremely low viral load of 897 for hepatitis C.  No genotype or HIV antibody was sent at that time.  He admits to daily heroin use/injection drugs with his most recent use being 2 days prior to this visit.  In an effort to lower his IV heroin use, he has been borrowing his girlfriend's Suboxone recently.  He has not attended a formal rehab or methadone clinic himself, however.  In addition to his IV drug use, he continues to drink alcohol heavily, often consuming 1-2 5ths of liquor per day.  In terms of HCV risk factors, as above he admits to IV drug use on a near daily basis.  He also has used intranasal cocaine.  He has several tattoos but denies any body piercings.  He denies receiving any blood transfusions in the past and is unaware of any prior sexual contacts having hepatitis C.  He denies any known jaundicing illness in the past.  He is anxious to begin treatment for hepatitis C.  Past Medical History:  Diagnosis Date  . Abscess of left foot    tx'ed in 2020  . Drug abuse, opioid type (HCC)   . IV drug abuse [facility](HCC)     Past Surgical History:  Procedure Laterality Date  . TONSILLECTOMY       Family History  Problem Relation Age of Onset  . COPD Mother   . Cancer Father      Social History   Tobacco Use  . Smoking status: Current Every Day Smoker    Packs/day: 1.00    Years: 15.00    Pack years: 15.00    Types: Cigarettes  . Smokeless tobacco: Never Used  Substance Use Topics  . Alcohol use: Yes    Alcohol/week: 10.0 standard drinks    Types: 10 Shots of liquor per week    Comment: 10 shots/day  . Drug use: Yes    Types: IV, Methamphetamines, Heroin, MDMA (Ecstacy)   Comment: injecting suboxone from street    Former Administratorlandscaper, currently unemployed   has no history on file for sexual activity. GF is also HCV positive, no condoms in 6 years.  No Known Allergies   Outpatient Medications Prior to Visit  Medication Sig Dispense Refill  . acetaminophen (TYLENOL) 325 MG tablet Take 2 tablets (650 mg total) by mouth every 6 (six) hours.    . naproxen (NAPROSYN) 500 MG tablet Take 1 tablet (500 mg total) by mouth 2 (two) times daily with a meal. 40 tablet 1   No facility-administered medications prior to visit.      Review of Systems  Constitutional: Positive for fatigue. Negative for chills and fever.  HENT: Negative for congestion, hearing loss, rhinorrhea and sinus pressure.   Eyes: Negative for photophobia, pain, redness and visual disturbance.  Respiratory: Negative for apnea, cough, shortness of breath and wheezing.   Cardiovascular: Negative for chest pain and palpitations.  Gastrointestinal: Negative for abdominal pain, constipation, diarrhea, nausea and vomiting.  Endocrine: Negative for cold intolerance, heat intolerance, polydipsia and polyuria.  Genitourinary: Negative for decreased urine volume, dysuria, frequency, hematuria and testicular pain.  Musculoskeletal: Negative for  back pain, myalgias and neck pain.  Skin: Negative for pallor and rash.  Allergic/Immunologic: Negative for immunocompromised state.  Neurological: Negative for dizziness, seizures, syncope, speech difficulty and light-headedness.  Hematological: Does not bruise/bleed easily.  Psychiatric/Behavioral: Positive for sleep disturbance. Negative for agitation and hallucinations. The patient is nervous/anxious.      Vitals:   04/04/19 1503  BP: 131/79  Pulse: 97     Physical Exam Gen: anxious, NAD, A&Ox 3 Head: NCAT, no temporal wasting evident EENT: PERRL, EOMI, MMM, adequate dentition Neck: supple, no JVD CV: NRRR, no murmurs evident Pulm: CTA bilaterally, no  wheeze or retractions Abd: soft, NTND, +BS Extrems: trace LE edema, 2+ pulses Skin: no rashes, adequate skin turgor Neuro: CN II-XII grossly intact, no focal neurologic deficits appreciated, gait was normal, A&Ox 3   Labs: No results found for: HEPBSAG  No results found for: HCVRNAPCRQN  No results found for: FIBROSTAGE  No results found for: HCVGENOTYPE  Lab Results  Component Value Date   WBC 9.4 12/18/2018   HGB 15.4 12/18/2018   HCT 44.8 12/18/2018   MCV 92.4 12/18/2018   PLT 187 12/18/2018       Chemistry      Component Value Date/Time   NA 137 12/18/2018 2012   K 3.6 12/18/2018 2012   CL 101 12/18/2018 2012   CO2 21 (L) 12/18/2018 2012   BUN 23 (H) 12/18/2018 2012   CREATININE 0.83 12/18/2018 2012      Component Value Date/Time   CALCIUM 9.6 12/18/2018 2012   ALKPHOS 89 12/18/2018 2012   AST 459 (H) 12/18/2018 2012   ALT 152 (H) 12/18/2018 2012   BILITOT 2.6 (H) 12/18/2018 2012        Assessment/Plan: HCV - HCV Ab was positive in 4/18, thus confirming past exposure to pathogen. Note: this screening test will remain positive/reactive life-long even if HCV infection has been cured/immunologically cleared. Most likely risk factor for acquisition was past and present IV drug use, intranasal cocaine use, tattoo placement, and less likely sexual transmission. Check quantitative HCV viral load and HCV genotype to assess for chronic infection. Check HIV Ab to exclude co-infection as well. If chronic infection is confirmed, will need to proceed with FibroScan or FibroSURE testing to determine stage of fibrosis and best directly active antiviral treatment options. F/u in 6 weeks to review lab results.   Substance abuse -I have significant reservations in treating this patient given that he has both active heavy alcohol use and daily heroin use.  In addition he more recently has been crushing his girlfriend's Suboxone and injecting this as well.  Feel the patient would  benefit from psychiatric counseling however his lack of insurance provides barrier for him at the present time.  I have encouraged the patient to decrease his alcohol and injection drug use and seek out alcoholics anonymous or Narcotics Anonymous meetings.  My hope would be is by the next visit the patient will have secured a sponsor and at least him decrease his use of both of these habits.  The patient shows at least an effort I can consider treating for hepatitis C.  In the meantime we will work-up his infection as noted above.  Patient was instructed not share needles when injecting drugs and use condoms with all sexual intercourse.  Health maintenance -  I have counselled the patient extensively re: the need for barrier precautions with sexual activity in order to prevent sexual transmission of HCV tx. He must continue these  practices until 3 months after treatment completion until a sustained virologic response (SVR) has been confirmed to establish a cure of her infection. He expressed full understanding of these instructions. Vaccination for hepatitis A & B was recommended as was abstinence from cigarettes, illicit drugs, and alcohol.  Given the patient's intense alcohol use, I have encouraged him to taper his alcohol use over the next 3 to 4 weeks rather than stopping completely.  I offered a tapering strategy for the patient at today's visit.

## 2019-04-04 NOTE — Patient Instructions (Signed)
Attend AA meetings regularly and secure an [place] sponsor by next visit. Decrease alcohol intake by at least half by next visit. Stop injecting drugs. Return to clinic in 6 weeks to review lab results.

## 2019-04-05 ENCOUNTER — Telehealth: Payer: Self-pay | Admitting: Pharmacy Technician

## 2019-04-05 NOTE — Telephone Encounter (Signed)
[facility] Patient Advocate Encounter  Patient was in the clinic for an appointment on 06/18 and confirmed there was no insurance coverage. He was still currently using so we will wait until his follow-up appointment on 05/21/2019 @ 08:45am to fill out the patient assistance application based on the medication prescribed. He is currently unemployed but seeking job opportunities, will obtain income information and household size at the August appointment.   Bartholomew Crews, CPhT Specialty Pharmacy Patient [facility] Phone: [phone] Fax: [phone] 04/05/2019 11:31 AM

## 2019-04-12 LAB — HEPATITIS C RNA QUANTITATIVE
HCV Quantitative Log: 5.85 Log IU/mL — ABNORMAL HIGH
HCV RNA, PCR, QN: 713000 IU/mL — ABNORMAL HIGH

## 2019-04-12 LAB — HEPATITIS C GENOTYPE: HCV Genotype: 3

## 2019-04-12 LAB — HEPATITIS B SURFACE ANTIGEN: Hepatitis B Surface Ag: NONREACTIVE

## 2019-04-12 LAB — HIV ANTIBODY (ROUTINE TESTING W REFLEX): HIV 1&2 Ab, 4th Generation: NONREACTIVE

## 2019-05-21 ENCOUNTER — Ambulatory Visit: Payer: Self-pay | Admitting: Infectious Diseases

## 2019-05-23 ENCOUNTER — Encounter: Payer: Self-pay | Admitting: Infectious Diseases

## 2019-05-23 ENCOUNTER — Ambulatory Visit (INDEPENDENT_AMBULATORY_CARE_PROVIDER_SITE_OTHER): Payer: Self-pay | Admitting: Infectious Diseases

## 2019-05-23 ENCOUNTER — Other Ambulatory Visit: Payer: Self-pay

## 2019-05-23 VITALS — BP 110/74 | HR 93 | Temp 98.0°F | Wt 130.0 lb

## 2019-05-23 DIAGNOSIS — F1990 Other psychoactive substance use, unspecified, uncomplicated: Secondary | ICD-10-CM

## 2019-05-23 DIAGNOSIS — F1010 Alcohol abuse, uncomplicated: Secondary | ICD-10-CM

## 2019-05-23 DIAGNOSIS — B182 Chronic viral hepatitis C: Secondary | ICD-10-CM

## 2019-05-23 NOTE — Patient Instructions (Signed)
Stop drinking all alcohol within 1 week. Return to clinic in 4 weeks for lab work. Return to clinic in 7 weeks for appointment with Dr. Prince Rome.

## 2019-05-23 NOTE — Progress Notes (Signed)
Tanner Hoffman  [id number]  [birth year]    HPI: The patient is a 28 y.o. y/o Not Hispanic or Latino male who presents today for an evaluation for HCV.  He was last seen in our clinic on April 04, 2019.  His last IVDU was 1 day prior to his last appointment with me. He is now sharing his girlfriend's daily suboxone (she gets 2 pills daily, so they each take one pill daily).  He has at least stopped injecting her Suboxone since his last visit.  He has cut down on his alcohol from one fifth a day to 2 cocktails every other day. Labs confirm chronic hepatitis C with genotype 3 and HCV viral load of 713,000 IU.  His HIV antibody was negative in June and his hepatitis B surface antigen was nonreactive.  He remains anxious to begin hepatitis C treatment.  He is otherwise without new complaints today.  Past Medical History:  Diagnosis Date   Abscess of left foot    tx'ed in 2020   Drug abuse, opioid type ([place])    IV drug abuse ([place])     Past Surgical History:  Procedure Laterality Date   TONSILLECTOMY       Family History  Problem Relation Age of Onset   COPD Mother    Cancer Father      Social History   Tobacco Use   Smoking status: Current Every Day Smoker    Packs/day: 1.00    Years: 15.00    Pack years: 15.00    Types: Cigarettes   Smokeless tobacco: Never Used  Substance Use Topics   Alcohol use: Yes    Alcohol/week: 10.0 standard drinks    Types: 10 Shots of liquor per week    Comment: 10 shots/day   Drug use: Yes    Types: IV, Methamphetamines, Heroin, MDMA (Ecstacy)    Comment: injecting suboxone from street    Former Development worker, international aid, currently unemployed   has no history on file for sexual activity. GF is also HCV positive, no condoms in 6 years.  No Known Allergies   Outpatient Medications Prior to Visit  Medication Sig Dispense Refill   acetaminophen (TYLENOL) 325 MG tablet Take 2 tablets (650 mg total) by mouth every 6 (six) hours.     naproxen  (NAPROSYN) 500 MG tablet Take 1 tablet (500 mg total) by mouth 2 (two) times daily with a meal. 40 tablet 1   No facility-administered medications prior to visit.      Review of Systems  Constitutional: Positive for fatigue. Negative for chills and fever.  HENT: Negative for congestion, hearing loss, rhinorrhea and sinus pressure.   Eyes: Negative for photophobia, pain, redness and visual disturbance.  Respiratory: Negative for apnea, cough, shortness of breath and wheezing.   Cardiovascular: Negative for chest pain and palpitations.  Gastrointestinal: Negative for abdominal pain, constipation, diarrhea, nausea and vomiting.  Endocrine: Negative for cold intolerance, heat intolerance, polydipsia and polyuria.  Genitourinary: Negative for decreased urine volume, dysuria, frequency, hematuria and testicular pain.  Musculoskeletal: Negative for back pain, myalgias and neck pain.  Skin: Negative for pallor and rash.  Allergic/Immunologic: Negative for immunocompromised state.  Neurological: Negative for dizziness, seizures, syncope, speech difficulty and light-headedness.  Hematological: Does not bruise/bleed easily.  Psychiatric/Behavioral: Negative for agitation and hallucinations. The patient is nervous/anxious.      Vitals:   05/23/19 0952  BP: 110/74  Pulse: 93  Temp: 98 F (36.7 C)     Physical Exam  Gen: pleasant, anxious, NAD, A&Ox 3 Head: NCAT, no temporal wasting evident EENT: PERRL, EOMI, MMM, adequate dentition Neck: supple, no JVD CV: NRRR, no murmurs evident Pulm: CTA bilaterally, no wheeze or retractions Abd: soft, NTND, +BS Extrems: trace LE edema, 2+ pulses Skin: no rashes, adequate skin turgor Neuro: CN II-XII grossly intact, no focal neurologic deficits appreciated, gait was normal, A&Ox 3   Labs: Lab Results  Component Value Date   HEPBSAG NON-REACTIVE 04/04/2019    Lab Results  Component Value Date   HCVRNAPCRQN 713,000 (H) 04/04/2019    No results  found for: [facility]  Lab Results  Component Value Date   HCVGENOTYPE 3 04/04/2019    Lab Results  Component Value Date   WBC 9.4 12/18/2018   HGB 15.4 12/18/2018   HCT 44.8 12/18/2018   MCV 92.4 12/18/2018   PLT 187 12/18/2018       Chemistry      Component Value Date/Time   NA 137 12/18/2018 2012   K 3.6 12/18/2018 2012   CL 101 12/18/2018 2012   CO2 21 (L) 12/18/2018 2012   BUN 23 (H) 12/18/2018 2012   CREATININE 0.83 12/18/2018 2012      Component Value Date/Time   CALCIUM 9.6 12/18/2018 2012   ALKPHOS 89 12/18/2018 2012   AST 459 (H) 12/18/2018 2012   ALT 152 (H) 12/18/2018 2012   BILITOT 2.6 (H) 12/18/2018 2012        Assessment/Plan: Patient is a 28 year old male with a history of injection drug use and alcohol abuse presenting for management of chronic hepatitis C.  HCV - HCV Ab was positive in 4/18, thus confirming past exposure to pathogen. Note: this screening test will remain positive/reactive life-long even if HCV infection has been cured/immunologically cleared. Most likely risk factor for acquisition was past/recent IV drug use, intranasal cocaine use, tattoo placement, and less likely sexual transmission.  Chronic infection is now confirmed with genotype 3 virus and HCV viral load of 713,000 IU.  His HIV antibody and hepatitis B surface antigen were both negative. Now that chronic HCV infection has been confirmed, will need to proceed with FibroSURE testing to determine stage of fibrosis and best directly active antiviral treatment options.  As this lab is most helpful when the patient has been completely off alcohol for minimum of 3 weeks, he will return when he has met this goal for this testing.  F/u in 6 weeks to review lab results.   Substance abuse - I have significant reservations in treating this patient given that he has both active heavy alcohol use and daily heroin use.    Fortunately he has decreased his alcohol use, but not to the level that  would allow for accurate FibroSure testing.  Prolonged discussion with the patient regarding his need to continue to taper his alcohol use down.  He expresses that he would like to do this himself and reduce his alcohol intake to allow for accurate FibroSure testing.  I feel the patient would benefit from psychiatric counseling to best accomplish this goal, however his lack of insurance a provides barrier for him at the present time.  I have encouraged the patient to continue to decrease his alcohol intake and seek out alcoholics anonymous or Narcotics Anonymous meetings.    He was congratulated on stopping his injection drug use habit since his last visit.  My hope would be is by the next visit the patient will have secured a sponsor and at least him decrease  his use of both of these habits.  The patient shows at least an effort I can consider treating for hepatitis C.  In the meantime, we will work-up his infection as noted above.  Patient was instructed not share needles when injecting drugs and use condoms with all sexual intercourse.  Health maintenance -  I have counselled the patient extensively re: the need for barrier precautions with sexual activity in order to prevent sexual transmission of HCV tx. He must continue these practices until 3 months after treatment completion until a sustained virologic response (SVR) has been confirmed to establish a cure of her infection. He expressed full understanding of these instructions. Vaccination for hepatitis A & B was recommended as was abstinence from cigarettes, illicit drugs, and alcohol.  Given the patient's intense alcohol use, I have encouraged him to taper his alcohol use over the next 3 to 4 weeks rather than stopping completely.  I offered a tapering strategy again for the patient at today's visit.

## 2019-07-04 ENCOUNTER — Other Ambulatory Visit: Payer: Self-pay

## 2019-07-15 ENCOUNTER — Other Ambulatory Visit: Payer: Self-pay

## 2019-07-25 ENCOUNTER — Ambulatory Visit: Payer: Self-pay | Admitting: Infectious Diseases

## 2019-07-25 ENCOUNTER — Telehealth: Payer: Self-pay | Admitting: Pharmacy Technician

## 2019-07-25 NOTE — Telephone Encounter (Signed)
[facility] Tanner Advocate Encounter  Tanner Hoffman is uninsured.  All paperwork has been prepared for Tanner Hoffman to apply for Tanner assistance to receive a Hepatitis C medication to him at no charge.  Tanner Hoffman. Tanner Hoffman [facility] Phone: [phone] Fax:  [phone]

## 2019-08-14 ENCOUNTER — Other Ambulatory Visit: Payer: Self-pay

## 2019-08-21 ENCOUNTER — Other Ambulatory Visit: Payer: Self-pay

## 2019-08-21 DIAGNOSIS — B182 Chronic viral hepatitis C: Secondary | ICD-10-CM

## 2019-08-21 DIAGNOSIS — F1010 Alcohol abuse, uncomplicated: Secondary | ICD-10-CM

## 2019-08-26 ENCOUNTER — Telehealth: Payer: Self-pay

## 2019-08-26 ENCOUNTER — Other Ambulatory Visit: Payer: Self-pay

## 2019-08-26 NOTE — Telephone Encounter (Signed)
Looks like he is due for a fibrotest. He should be sober from alcohol for 3 weeks prior to the test being drawn for it to give [place] the most accurate result though.

## 2019-08-26 NOTE — Telephone Encounter (Signed)
Patient scheduled for labs today if he shows up for appointment. Routing to Dr. Prince Rome to confirm which labs to order. Eugenia Mcalpine

## 2019-08-28 ENCOUNTER — Encounter: Payer: Self-pay | Admitting: Infectious Diseases

## 2019-09-10 ENCOUNTER — Ambulatory Visit: Payer: Self-pay | Admitting: Infectious Diseases

## 2019-09-30 ENCOUNTER — Telehealth: Payer: Self-pay | Admitting: Family

## 2019-09-30 DIAGNOSIS — B182 Chronic viral hepatitis C: Secondary | ICD-10-CM

## 2019-09-30 NOTE — Telephone Encounter (Signed)
Tanner Hoffman was previously seen for treatment of Hepatitis C infection. Please have him schedule a lab appointment so that we may get up to date lab work and get him treated. Lab work has been placed with this note.

## 2019-10-01 NOTE — Telephone Encounter (Addendum)
[facility] Patient Advocate Encounter  Patient's insurance status remains uninsured. Applications for both manufacturers has been signed by the patient. At his lab appointment we can find out his updated income and household size and make sure demographics and phone number are the same as June. Left voicemail on [phone] phone number to give our office a call back.

## 2019-10-01 NOTE — Telephone Encounter (Signed)
Left patient voice mail to call back to schedule a lab appointment.

## 2019-10-02 ENCOUNTER — Other Ambulatory Visit: Payer: Self-pay

## 2019-10-02 DIAGNOSIS — B182 Chronic viral hepatitis C: Secondary | ICD-10-CM

## 2019-10-13 LAB — CBC
HCT: 52.5 % — ABNORMAL HIGH (ref 38.5–50.0)
Hemoglobin: 18.4 g/dL — ABNORMAL HIGH (ref 13.2–17.1)
MCH: 32.3 pg (ref 27.0–33.0)
MCHC: 35 g/dL (ref 32.0–36.0)
MCV: 92.3 fL (ref 80.0–100.0)
MPV: 10.3 fL (ref 7.5–12.5)
Platelets: 196 10*3/uL (ref 140–400)
RBC: 5.69 10*6/uL (ref 4.20–5.80)
RDW: 13.3 % (ref 11.0–15.0)
WBC: 8.1 10*3/uL (ref 3.8–10.8)

## 2019-10-13 LAB — LIVER FIBROSIS, FIBROTEST-ACTITEST
ALT: 57 U/L — ABNORMAL HIGH (ref 9–46)
Alpha-2-Macroglobulin: 172 mg/dL (ref 106–279)
Apolipoprotein A1: 207 mg/dL — ABNORMAL HIGH (ref 94–176)
Bilirubin: 1.4 mg/dL — ABNORMAL HIGH (ref 0.2–1.2)
Fibrosis Score: 0.15
GGT: 66 U/L (ref 3–90)
Haptoglobin: 134 mg/dL (ref 43–212)
Necroinflammat ACT Score: 0.29
Reference ID: 3202902

## 2019-10-13 LAB — HEPATIC FUNCTION PANEL
AG Ratio: 1.7 (calc) (ref 1.0–2.5)
ALT: 58 U/L — ABNORMAL HIGH (ref 9–46)
AST: 64 U/L — ABNORMAL HIGH (ref 10–40)
Albumin: 4.9 g/dL (ref 3.6–5.1)
Alkaline phosphatase (APISO): 93 U/L (ref 36–130)
Bilirubin, Direct: 0.3 mg/dL — ABNORMAL HIGH (ref 0.0–0.2)
Globulin: 2.9 g/dL (calc) (ref 1.9–3.7)
Indirect Bilirubin: 1.2 mg/dL (calc) (ref 0.2–1.2)
Total Bilirubin: 1.5 mg/dL — ABNORMAL HIGH (ref 0.2–1.2)
Total Protein: 7.8 g/dL (ref 6.1–8.1)

## 2019-10-13 LAB — PROTIME-INR
INR: 1.1
Prothrombin Time: 11.4 s (ref 9.0–11.5)

## 2019-10-13 LAB — HEPATITIS C RNA QUANTITATIVE
HCV Quantitative Log: 5.45 Log IU/mL — ABNORMAL HIGH
HCV RNA, PCR, QN: 281000 IU/mL — ABNORMAL HIGH

## 2019-10-15 ENCOUNTER — Telehealth: Payer: Self-pay

## 2019-10-15 NOTE — Telephone Encounter (Signed)
COVID-19 Pre-Screening Questions:10/15/19  Do you currently have a fever (>100 F), chills or unexplained body aches? NO  Are you currently experiencing new cough, shortness of breath, sore throat, runny nose? NO  .  Marland Kitchen Have you recently travelled outside the state of [place] in the last 14 days?NO .  Have you been in contact with someone that is currently pending confirmation of Covid19 testing or has been confirmed to have the [place] virus? NO  **If the patient answers NO to ALL questions -  advise the patient to please call the clinic before coming to the office should any symptoms develop.

## 2019-10-16 ENCOUNTER — Telehealth: Payer: Self-pay | Admitting: Pharmacy Technician

## 2019-10-16 ENCOUNTER — Encounter: Payer: Self-pay | Admitting: Family

## 2019-10-16 ENCOUNTER — Other Ambulatory Visit: Payer: Self-pay

## 2019-10-16 ENCOUNTER — Ambulatory Visit (INDEPENDENT_AMBULATORY_CARE_PROVIDER_SITE_OTHER): Payer: Self-pay | Admitting: Family

## 2019-10-16 VITALS — BP 132/72 | HR 86 | Temp 98.0°F | Ht 71.0 in | Wt 134.0 lb

## 2019-10-16 DIAGNOSIS — F1910 Other psychoactive substance abuse, uncomplicated: Secondary | ICD-10-CM

## 2019-10-16 DIAGNOSIS — B182 Chronic viral hepatitis C: Secondary | ICD-10-CM

## 2019-10-16 MED ORDER — SOFOSBUVIR-VELPATASVIR 400-100 MG PO TABS: 1.0000 | ORAL_TABLET | Freq: Every day | ORAL | 2 refills | Status: DC

## 2019-10-16 NOTE — Assessment & Plan Note (Signed)
Has remained sober from IV drug use and currently on suboxone. Discussed importance of establishing in a Suboxone clinic as well as counseling to aid in recovery.
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
## 2019-10-16 NOTE — Assessment & Plan Note (Signed)
Mr. Sochacki has Genotype 3 chronic Hepatitis C obtained from IV drug use with a viral load of 281,000 and fibrosis score of F0, FIB-4 of 1.29 and APRI or 0.816. His risk of fibrosis is low/moderate and will not likely need hepatocellular carcinoma screening at the completion of treatment. We reviewed the pathophysiology, transmission, risks if left untreated and treatment options for Hepatitis C. He met with our pharmacy staff to complete medication assistance paperwork. Forms for [place] Assistance provided. Will plan treatment with 12 weeks of Epclusa. Follow up office visit 1 month after starting medication.  

## 2019-10-16 NOTE — Patient Instructions (Signed)
Nice to meet you.  We will get you started on treatment within the next week.   Plan to follow up 1 month after starting medication for repeat blood work.   Limit acetaminophen (Tylenol) usage to no more than 2 grams (2,000 mg) per day.  Avoid alcohol.  Do not share toothbrushes or razors.  Practice safe sex to protect against transmission as well as sexually transmitted disease.    Hepatitis C Hepatitis C is a viral infection of the liver. It can lead to scarring of the liver (cirrhosis), liver failure, or liver cancer. Hepatitis C may go undetected for months or years because people with the infection may not have symptoms, or they may have only mild symptoms. What are the causes? This condition is caused by the hepatitis C virus (HCV). The virus can spread from person to person (is contagious) through:  Blood.  Childbirth. A woman who has hepatitis C can pass it to her baby during birth.  Bodily fluids, such as breast milk, tears, semen, vaginal fluids, and saliva.  Blood transfusions or organ transplants done in the [place] before 1992.  What increases the risk? The following factors may make you more likely to develop this condition:  Having contact with unclean (contaminated) needles or syringes. This may result from: ? Acupuncture. ? Tattoing. ? Body piercing. ? Injecting drugs.  Having unprotected sex with someone who is infected.  Needing treatment to filter your blood (kidney dialysis).  Having HIV (human immunodeficiency virus) or AIDS (acquired immunodeficiency syndrome).  Working in a job that involves contact with blood or bodily fluids, such as health care.  What are the signs or symptoms? Symptoms of this condition include:  Fatigue.  Loss of appetite.  Nausea.  Vomiting.  Abdominal pain.  Dark yellow urine.  Yellowish skin and eyes (jaundice).  Itchy skin.  Clay-colored bowel movements.  Joint pain.  Bleeding and bruising  easily.  Fluid building up in your stomach (ascites).  In some cases, you may not have any symptoms. How is this diagnosed? This condition is diagnosed with:  Blood tests.  Other tests to check how well your liver is functioning. They may include: ? Magnetic resonance elastography (MRE). This imaging test uses MRIs and sound waves to measure liver stiffness. ? Transient elastography. This imaging test uses ultrasounds to measure liver stiffness. ? Liver biopsy. This test requires taking a small tissue sample from your liver to examine it under a microscope.  How is this treated? Your health care provider may perform noninvasive tests or a liver biopsy to help decide the best course of treatment. Treatment may include:  Antiviral medicines and other medicines.  Follow-up treatments every 6-12 months for infections or other liver conditions.  Receiving a donated liver (liver transplant).  Follow these instructions at home: Medicines  Take over-the-counter and prescription medicines only as told by your health care provider.  Take your antiviral medicine as told by your health care provider. Do not stop taking the antiviral even if you start to feel better.  Do not take any medicines unless approved by your health care provider, including over-the-counter medicines and birth control pills. Activity  Rest as needed.  Do not have sex unless approved by your health care provider.  Ask your health care provider when you may return to school or work. Eating and drinking  Eat a balanced diet with plenty of fruits and vegetables, whole grains, and lowfat (lean) meats or non-meat proteins (such as beans or  tofu).  Drink enough fluids to keep your urine clear or pale yellow.  Do not drink alcohol. General instructions  Do not share toothbrushes, nail clippers, or razors.  Wash your hands frequently with soap and water. If soap and water are not available, use hand  sanitizer.  Cover any cuts or open sores on your skin to prevent spreading the virus.  Keep all follow-up visits as told by your health care provider. This is important. You may need follow-up visits every 6-12 months. How is this prevented? There is no vaccine for hepatitis C. The only way to prevent the disease is to reduce the risk of exposure to the virus. Make sure you:  Wash your hands frequently with soap and water. If soap and water are not available, use hand sanitizer.  Do not share needles or syringes.  Practice safe sex and use condoms.  Avoid handling blood or bodily fluids without gloves or other protection.  Avoid getting tattoos or piercings in shops or other locations that are not clean.  Contact a health care provider if:  You have a fever.  You develop abdominal pain.  You pass dark urine.  You pass clay-colored stools.  You develop joint pain. Get help right away if:  You have increasing fatigue or weakness.  You lose your appetite.  You cannot eat or drink without vomiting.  You develop jaundice or your jaundice gets worse.  You bruise or bleed easily. Summary  Hepatitis C is a viral infection of the liver. It can lead to scarring of the liver (cirrhosis), liver failure, or liver cancer.  The hepatitis C virus (HCV) causes this condition. The virus can pass from person to person (is contagious).  You should not take any medicines unless approved by your health care provider. This includes over-the-counter medicines and birth control pills. This information is not intended to replace advice given to you by your health care provider. Make sure you discuss any questions you have with your health care provider. Document Released: 09/30/2000 Document Revised: 11/08/2016 Document Reviewed: 11/08/2016 Elsevier Interactive Patient Education  [employer].

## 2019-10-16 NOTE — Telephone Encounter (Signed)
[facility] Patient Advocate Encounter  Patient signed application for Tanner Hoffman and updated demographic information from his June appointment. The application has been faxed to support path for determination. Patient made aware of the process, compnay phone number and potential timeline. Will follow-up once we hear determination and schedule a follow-up appointment.

## 2019-10-22 NOTE — Telephone Encounter (Addendum)
[facility] Patient Advocate Encounter   Patient has been approved for [employer] Advancing Access Patient Assistance Program for Epclusa from 10/21/2019 to 01/13/2020. This assistance will make the patient's copay $0.  I have spoken with the patient and they will setup their first shipment with [facility]. It is anticipated to arrive within 2-3 business days. Patient is eager to start the medication. Will call at the end of the week to see start date and setup follow-up appointment.   [phone] is currently disconnected okay to call fiance [place] Myrick [phone]. Will have med mailed to house.  Patient knows to call the office with questions or concerns.  Beulah Gandy, CPhT Specialty Pharmacy Patient [facility] for Infectious Disease Phone: [phone] Fax: [phone] 10/22/2019 3:14 PM

## 2019-10-24 ENCOUNTER — Encounter: Payer: Self-pay | Admitting: Pharmacy Technician

## 2019-10-25 ENCOUNTER — Telehealth: Payer: Self-pay | Admitting: Pharmacist

## 2019-10-25 NOTE — Telephone Encounter (Signed)
Thank you, Tyler!

## 2019-11-27 NOTE — Progress Notes (Unsigned)
Hep C follow-up. On 04/04/19, lab work at the time confirmed a viral load of 713,000 with Genotype 3. Most recent blood work completed on 10/02/19 with Hepatitis C RNA level of 281,000 and Fibrotest of F0.  Hx of IVDU and currently on suboxone. Drinks alcohol. Girlfriend pos for Hep C but not on tx. Epclusa x12 weeks. Started medication in early to mid January. No insurance  Plan: Get HCV RNA and CMET. Ask how he is doing on suboxone or any side effects with Epclusa. Any missed doses? Needs flu shot and hep B vaccines.  Maybe hep A as well.

## 2019-11-28 ENCOUNTER — Ambulatory Visit: Payer: Self-pay | Admitting: Pharmacist

## 2020-01-08 ENCOUNTER — Telehealth: Payer: Self-pay | Admitting: Pharmacy Technician

## 2020-01-08 NOTE — Telephone Encounter (Signed)
[facility] Patient Advocate Encounter  [facility] reached out that they were unable to reach the patient to confirm his shipment of Epclusa.   I tried to reach out to the patient on both numbers but had to leave a voicemail.   He received 2 shipments on 10/31/2019 and 11/22/2019. For the 3rd shipment he told [facility] that he was getting married and didn't have time, that he would call back and the third attempt told them he was too busy to confirm address.

## 2020-01-09 NOTE — Telephone Encounter (Signed)
Ugh! Tanner Hoffman - another one to add to your list.

## 2020-03-13 ENCOUNTER — Inpatient Hospital Stay (HOSPITAL_COMMUNITY)
Admission: EM | Admit: 2020-03-13 | Discharge: 2020-03-17 | DRG: 603 | Disposition: A | Discharge status: Home / Self Care | Payer: Self-pay | Attending: Internal Medicine | Admitting: Internal Medicine

## 2020-03-13 ENCOUNTER — Encounter (HOSPITAL_COMMUNITY): Payer: Self-pay | Admitting: *Deleted

## 2020-03-13 ENCOUNTER — Other Ambulatory Visit: Payer: Self-pay

## 2020-03-13 ENCOUNTER — Emergency Department (HOSPITAL_COMMUNITY): Payer: Self-pay

## 2020-03-13 DIAGNOSIS — B9561 Methicillin susceptible Staphylococcus aureus infection as the cause of diseases classified elsewhere: Secondary | ICD-10-CM | POA: Diagnosis present

## 2020-03-13 DIAGNOSIS — M869 Osteomyelitis, unspecified: Secondary | ICD-10-CM | POA: Diagnosis present

## 2020-03-13 DIAGNOSIS — L03011 Cellulitis of right finger: Secondary | ICD-10-CM | POA: Diagnosis present

## 2020-03-13 DIAGNOSIS — L02511 Cutaneous abscess of right hand: Secondary | ICD-10-CM | POA: Diagnosis present

## 2020-03-13 DIAGNOSIS — M609 Myositis, unspecified: Secondary | ICD-10-CM | POA: Diagnosis present

## 2020-03-13 DIAGNOSIS — Z825 Family history of asthma and other chronic lower respiratory diseases: Secondary | ICD-10-CM

## 2020-03-13 DIAGNOSIS — S61001A Unspecified open wound of right thumb without damage to nail, initial encounter: Secondary | ICD-10-CM | POA: Diagnosis present

## 2020-03-13 DIAGNOSIS — B182 Chronic viral hepatitis C: Secondary | ICD-10-CM | POA: Diagnosis present

## 2020-03-13 DIAGNOSIS — L03113 Cellulitis of right upper limb: Principal | ICD-10-CM | POA: Diagnosis present

## 2020-03-13 DIAGNOSIS — Z20822 Contact with and (suspected) exposure to covid-19: Secondary | ICD-10-CM | POA: Diagnosis present

## 2020-03-13 DIAGNOSIS — Z801 Family history of malignant neoplasm of trachea, bronchus and lung: Secondary | ICD-10-CM

## 2020-03-13 DIAGNOSIS — F17200 Nicotine dependence, unspecified, uncomplicated: Secondary | ICD-10-CM | POA: Diagnosis present

## 2020-03-13 DIAGNOSIS — F1910 Other psychoactive substance abuse, uncomplicated: Secondary | ICD-10-CM | POA: Diagnosis present

## 2020-03-13 LAB — SARS CORONAVIRUS 2 BY RT PCR (HOSPITAL ORDER, PERFORMED IN ~~LOC~~ HOSPITAL LAB): SARS Coronavirus 2: NEGATIVE

## 2020-03-13 LAB — LACTIC ACID, PLASMA
Lactic Acid, Venous: 2.3 mmol/L (ref 0.5–1.9)
Lactic Acid, Venous: 1.8 mmol/L (ref 0.5–1.9)
Lactic Acid, Venous: 1.6 mmol/L (ref 0.5–1.9)

## 2020-03-13 LAB — COMPREHENSIVE METABOLIC PANEL
ALT: 16 U/L (ref 0–44)
AST: 22 U/L (ref 15–41)
Albumin: 3.7 g/dL (ref 3.5–5.0)
Alkaline Phosphatase: 91 U/L (ref 38–126)
Anion gap: 16 — ABNORMAL HIGH (ref 5–15)
BUN: 11 mg/dL (ref 6–20)
CO2: 19 mmol/L — ABNORMAL LOW (ref 22–32)
Calcium: 9.3 mg/dL (ref 8.9–10.3)
Chloride: 102 mmol/L (ref 98–111)
Creatinine, Ser: 0.55 mg/dL — ABNORMAL LOW (ref 0.61–1.24)
GFR calc Af Amer: >60 mL/min (ref >60)
GFR calc non Af Amer: >60 mL/min (ref >60)
Glucose, Bld: 95 mg/dL (ref 70–99)
Potassium: 3.7 mmol/L (ref 3.5–5.1)
Sodium: 137 mmol/L (ref 135–145)
Total Bilirubin: 0.8 mg/dL (ref 0.3–1.2)
Total Protein: 7.8 g/dL (ref 6.5–8.1)

## 2020-03-13 LAB — CBC WITH DIFFERENTIAL/PLATELET
Abs Immature Granulocytes: 0.03 10*3/uL (ref 0.00–0.07)
Basophils Absolute: 0 10*3/uL (ref 0.0–0.1)
Basophils Relative: 0 %
Eosinophils Absolute: 0.1 10*3/uL (ref 0.0–0.5)
Eosinophils Relative: 1 %
HCT: 41.9 % (ref 39.0–52.0)
Hemoglobin: 14.6 g/dL (ref 13.0–17.0)
Immature Granulocytes: 0 %
Lymphocytes Relative: 18 %
Lymphs Abs: 1.8 10*3/uL (ref 0.7–4.0)
MCH: 33.2 pg (ref 26.0–34.0)
MCHC: 34.8 g/dL (ref 30.0–36.0)
MCV: 95.2 fL (ref 80.0–100.0)
Monocytes Absolute: 0.9 10*3/uL (ref 0.1–1.0)
Monocytes Relative: 9 %
Neutro Abs: 7.3 10*3/uL (ref 1.7–7.7)
Neutrophils Relative %: 72 %
Platelets: 242 10*3/uL (ref 150–400)
RBC: 4.4 MIL/uL (ref 4.22–5.81)
RDW: 12.3 % (ref 11.5–15.5)
WBC: 10.1 10*3/uL (ref 4.0–10.5)
nRBC: 0 % (ref 0.0–0.2)

## 2020-03-13 MED ORDER — NAPROXEN 250 MG PO TABS
500.0000 mg | ORAL_TABLET | Freq: Two times a day (BID) | ORAL | Status: DC
  Administered 2020-03-14 – 2020-03-17 (×6): 500 mg via ORAL
  Filled 2020-03-13 (×7): qty 2

## 2020-03-13 MED ORDER — NICOTINE 14 MG/24HR TD PT24
14.0000 mg | MEDICATED_PATCH | Freq: Once | TRANSDERMAL | Status: AC
  Administered 2020-03-13: 14 mg via TRANSDERMAL
  Filled 2020-03-13: qty 1

## 2020-03-13 MED ORDER — LIDOCAINE HCL (PF) 1 % IJ SOLN
5.0000 mL | Freq: Once | INTRAMUSCULAR | Status: AC
  Administered 2020-03-13: 5 mL
  Filled 2020-03-13: qty 5

## 2020-03-13 MED ORDER — ENOXAPARIN SODIUM 40 MG/0.4ML ~~LOC~~ SOLN
40.0000 mg | SUBCUTANEOUS | Status: DC
  Administered 2020-03-13: 40 mg via SUBCUTANEOUS
  Filled 2020-03-13: qty 0.4

## 2020-03-13 MED ORDER — VANCOMYCIN HCL IN DEXTROSE 1-5 GM/200ML-% IV SOLN
1000.0000 mg | Freq: Three times a day (TID) | INTRAVENOUS | Status: DC
  Administered 2020-03-14 – 2020-03-15 (×5): 1000 mg via INTRAVENOUS
  Filled 2020-03-13 (×6): qty 200

## 2020-03-13 MED ORDER — BACITRACIN ZINC 500 UNIT/GM EX OINT
TOPICAL_OINTMENT | Freq: Two times a day (BID) | CUTANEOUS | Status: DC
  Filled 2020-03-13: qty 28.4

## 2020-03-13 MED ORDER — ACETAMINOPHEN 650 MG RE SUPP: 650.0000 mg | Freq: Four times a day (QID) | RECTAL | Status: DC | PRN

## 2020-03-13 MED ORDER — ONDANSETRON HCL 4 MG PO TABS: 4.0000 mg | ORAL_TABLET | Freq: Four times a day (QID) | ORAL | Status: DC | PRN

## 2020-03-13 MED ORDER — SODIUM CHLORIDE 0.9 % IV SOLN: INTRAVENOUS | Status: DC

## 2020-03-13 MED ORDER — SODIUM CHLORIDE 0.9 % IV SOLN
2.0000 g | Freq: Once | INTRAVENOUS | Status: AC
  Administered 2020-03-13: 2 g via INTRAVENOUS
  Filled 2020-03-13: qty 20

## 2020-03-13 MED ORDER — BUPRENORPHINE HCL-NALOXONE HCL 2-0.5 MG SL SUBL
1.0000 | SUBLINGUAL_TABLET | Freq: Every day | SUBLINGUAL | Status: DC
  Administered 2020-03-14 – 2020-03-17 (×4): 1 via SUBLINGUAL
  Filled 2020-03-13 (×4): qty 1

## 2020-03-13 MED ORDER — SODIUM CHLORIDE 0.9 % IV BOLUS
1000.0000 mL | Freq: Once | INTRAVENOUS | Status: AC
  Administered 2020-03-13: 1000 mL via INTRAVENOUS

## 2020-03-13 MED ORDER — KETOROLAC TROMETHAMINE 30 MG/ML IJ SOLN
30.0000 mg | Freq: Once | INTRAMUSCULAR | Status: AC
  Administered 2020-03-13: 30 mg via INTRAVENOUS
  Filled 2020-03-13: qty 1

## 2020-03-13 MED ORDER — SODIUM CHLORIDE 0.9 % IV SOLN
2.0000 g | INTRAVENOUS | Status: DC
  Administered 2020-03-14: 2 g via INTRAVENOUS
  Filled 2020-03-13 (×2): qty 20

## 2020-03-13 MED ORDER — ONDANSETRON HCL 4 MG/2ML IJ SOLN: 4.0000 mg | Freq: Four times a day (QID) | INTRAMUSCULAR | Status: DC | PRN

## 2020-03-13 MED ORDER — ACETAMINOPHEN 325 MG PO TABS: 650.0000 mg | ORAL_TABLET | Freq: Four times a day (QID) | ORAL | Status: DC | PRN

## 2020-03-13 MED ORDER — LIDOCAINE HCL 2 % IJ SOLN
20.0000 mL | Freq: Once | INTRAMUSCULAR | Status: AC
  Administered 2020-03-13: 400 mg via INTRADERMAL

## 2020-03-13 MED ORDER — ADULT MULTIVITAMIN W/MINERALS CH
1.0000 | ORAL_TABLET | Freq: Every day | ORAL | Status: DC
  Administered 2020-03-14 – 2020-03-17 (×4): 1 via ORAL
  Filled 2020-03-13 (×4): qty 1

## 2020-03-13 NOTE — ED Provider Notes (Signed)
Patient placed in Quick Look pathway, seen and evaluated   Chief Complaint: Pain and swelling of right thumb and hand.  HPI: Patient reports 1.5 weeks ago he suffered a small cut right thumb.  Since that time he has had increasing pain and swelling of the area with erythema.  He went to the hospital emergency department and had admitted started on IV antibiotics.  He reports that he left AMA yesterday.  Of note patient reports a history of IV drug use but has not injected in the last 6 months.  ROS: + Pain, swelling, erythema, streaking -Fever, chills, numbness, nausea/vomiting  Physical Exam:   Gen: No distress  Neuro: Awake and Alert  Skin: Warm    Focused Exam: Skin break present to tip of thumb, swelling erythema diffusely of the right thumb extending to the radial right hand.  Streaking present down right forearm towards the elbow, palmar side.  Range of motion at MCP intact, unable to move PIP secondary to pain and swelling.  Sensation intact to tip of finger.     CBC, CMP, blood cultures, lactic ordered.  Right hand x-ray ordered.    Initiation of care has begun. The patient has been counseled on the process, plan, and necessity for staying for the completion/evaluation, and the remainder of the medical screening examination    Elizabeth Palau 03/13/20 1414    Eber Hong, MD 03/14/20 [phone]

## 2020-03-13 NOTE — ED Triage Notes (Addendum)
To ED for further eval of right thumb pain and swelling/redness. Seen at [facility] yesterday for admit- started on IV vancomycin but left AMA. Seen in triage by PA. Severe swelling noted. Red streaks up right arm noted. Pt is alert and oriented. No fevers at home per pt. States a week ago he noticed a small cut to right thumb. Tylenol taken approx 4 hrs. Thumb is numb per pt

## 2020-03-13 NOTE — H&P (Signed)
[facility] History and Physical  JERICO GRISSO IDP:[id number] DOB: 1991-09-06 DOA: 03/13/2020   PCP: Patient, No Pcp Per  Specialists: Used to be seen in the ID clinic for hepatitis C.  Chief Complaint: Pain involving the right arm and hand  HPI: Tanner Hoffman is a 29 y.o. male with a past medical history of polysubstance abuse, hepatitis C who was in his usual state of health till about a week and a half ago when he sustained cut to the right thumb near the tip of the thumb.  He has had some pain in that area with some redness.  He was picking that site several days.  The swelling and the redness got worse.  He started noticing redness involving the entire hand and saw streaks running up his forearm.  He went to the ED in hospital in [place].  However he decided to leave AGAINST MEDICAL ADVICE.  Today he decided to come here for further treatment.  Pain is currently 6 out of 10 in intensity but mainly when he is moving his hand and his thumb.  Denies any fever or chills at home.  No nausea or vomiting.  Denies lesions in any other part of his body.  He mentions that he has not done any IV drug use in the last 6 months.  He says that he gets Suboxone from an unknown source and takes 1 tablet daily, 2 mg each.  He says that he has not been able to establish at a Suboxone clinic due to lack of insurance.  In the emergency department patient was noted to have significant swelling involving his right hand especially his right thumb.  Erythematous streaks were noted going up the forearm.  He has WBC was normal.  His lactic acid was mildly elevated.  Discussions were held with Dr. Janee Morn with hand surgery.  Patient underwent x-ray which raised concern for osteomyelitis involving the distal phalanx.  Patient will need hospitalization for further management.  Home Medications: Prior to Admission medications   Medication Sig Start Date End Date Taking? Authorizing Provider  acetaminophen  (TYLENOL) 500 MG tablet Take 500 mg by mouth every 6 (six) hours as needed for mild pain.   Yes [provider]  buprenorphine-naloxone (SUBOXONE) 2-0.5 mg SUBL SL tablet Place 1 tablet under the tongue daily as needed (For overdose).    Yes [provider]  acetaminophen (TYLENOL) 325 MG tablet Take 2 tablets (650 mg total) by mouth every 6 (six) hours. Patient not taking: Reported on 10/16/2019 01/03/17   Adam Phenix, PA-C  naproxen (NAPROSYN) 500 MG tablet Take 1 tablet (500 mg total) by mouth 2 (two) times daily with a meal. Patient not taking: Reported on 10/16/2019 01/01/17   Karie Soda, MD  Sofosbuvir-Velpatasvir (EPCLUSA) 400-100 MG TABS Take 1 tablet by mouth daily. Take 1 tablet by mouth daily. Patient not taking: Reported on 03/13/2020 10/16/19   Veryl Speak, FNP    Allergies: No Known Allergies  Past Medical History: Past Medical History:  Diagnosis Date  . Abscess of left foot    tx'ed in 2020  . Drug abuse, opioid type (HCC)   . IV drug abuse [facility])     Past Surgical History:  Procedure Laterality Date  . TONSILLECTOMY      Social History: Lives with his fiance.  Cigarettes on a daily basis.  Consumes 2 beers on a daily basis.  Denies any liquor use.  No illicit drug use.  Family  History:  Family History  Problem Relation Age of Onset  . COPD Mother   . Cancer Father   Father died of lung cancer.  Review of Systems - History obtained from the patient General ROS: positive for  - fatigue Psychological ROS: negative Ophthalmic ROS: negative ENT ROS: negative Allergy and Immunology ROS: negative Hematological and Lymphatic ROS: negative Endocrine ROS: negative Respiratory ROS: no cough, shortness of breath, or wheezing Cardiovascular ROS: no chest pain or dyspnea on exertion Gastrointestinal ROS: no abdominal pain, change in bowel habits, or black or bloody stools Genito-Urinary ROS: no dysuria, trouble voiding, or  hematuria Musculoskeletal ROS: As in HPI Neurological ROS: no TIA or stroke symptoms Dermatological ROS: As in HPI  Physical Examination  Vitals:   03/13/20 1323 03/13/20 1411 03/13/20 1412  BP:   115/70  Pulse:  (!) 102   Resp:  16   Temp:  98.3 F (36.8 C)   TempSrc: Oral Oral   SpO2:  99%   Weight:   61.2 kg  Height:   5\' 10"  (1.778 m)    BP 115/70   Pulse (!) 102   Temp 98.3 F (36.8 C) (Oral)   Resp 16   Ht 5\' 10"  (1.778 m)   Wt 61.2 kg   SpO2 99%   BMI 19.37 kg/m   General appearance: alert, cooperative, appears stated age and no distress Head: Normocephalic, without obvious abnormality, atraumatic Eyes: conjunctivae/corneas clear. PERRL, EOM's intact.  Throat: lips, mucosa, and tongue normal; teeth and gums normal Neck: no adenopathy, no carotid bruit, no JVD, supple, symmetrical, trachea midline and thyroid not enlarged, symmetric, no tenderness/mass/nodules Back: negative, symmetric, no curvature. ROM normal. No CVA tenderness. Resp: clear to auscultation bilaterally Cardio: regular rate and rhythm, S1, S2 normal, no murmur, click, rub or gallop GI: soft, non-tender; bowel sounds normal; no masses,  no organomegaly Extremities: Right hand noted to be swollen especially the right thumb.  Erythematous streaking noted going up the forearm.  Restricted range of motion of the right wrist.  The tip of the thumb noted to be quite erythematous with a bruised appearance.  Tender to palpation.  Restricted range of motion. Pulses: 2+ and symmetric Skin: Erythema involving the right hand including the thumb Lymph nodes: Cervical, supraclavicular, and axillary nodes normal. Neurologic: Alert and oriented x3.  No focal neurological deficits.          Labs on Admission: I have personally reviewed following labs and imaging studies  CBC: Recent Labs  Lab 03/13/20 1435  WBC 10.1  NEUTROABS 7.3  HGB 14.6  HCT 41.9  MCV 95.2  PLT 242   Basic Metabolic  Panel: Recent Labs  Lab 03/13/20 1435  NA 137  K 3.7  CL 102  CO2 19*  GLUCOSE 95  BUN 11  CREATININE 0.55*  CALCIUM 9.3   GFR: Estimated Creatinine Clearance: 116.9 mL/min (A) (by C-G formula based on SCr of 0.55 mg/dL (L)). Liver Function Tests: Recent Labs  Lab 03/13/20 1435  AST 22  ALT 16  ALKPHOS 91  BILITOT 0.8  PROT 7.8  ALBUMIN 3.7     Radiological Exams on Admission: DG Hand Complete Right  Result Date: 03/13/2020 CLINICAL DATA:  Cellulitis of the right thumb secondary to and laceration 1.5 weeks ago. Redness and swelling. Draining pus. EXAM: RIGHT HAND - COMPLETE 3+ VIEW COMPARISON:  None. FINDINGS: There is diffuse soft tissue swelling of the hand and of the entire right thumb. Soft tissue irregularity of the  tip of the thumb. On a single view there is loss of the cortex of the radial aspect of the tuft of the distal phalanx of the thumb which could represent osteomyelitis. IMPRESSION: 1. Diffuse soft tissue swelling of the hand and of the entire right thumb. 2. Soft tissue irregularity at the tip of the thumb. 3. Possible osteomyelitis of the tuft of the distal phalanx of the thumb. Electronically Signed   By: Lorriane Shire M.D.   On: 03/13/2020 15:17     Problem List  Principal Problem:   Cellulitis of right hand Active Problems:   Chronic hepatitis C without hepatic coma (HCC)   Open wound of right thumb   Assessment: This is a 29 year old Caucasian male with past medical history as stated earlier who comes after sustaining a cut to his thumb about a week and a half ago and now with swelling and redness involving his thumb as well as his right hand. He has cellulitis of the thumb as well as the right hand at this time. There is concern for osteomyelitis involving the distal phalanx of the thumb.    Plan: 1. Cellulitis involving the right thumb and hand with concern for osteomyelitis: ED providers have discussed with Dr. Grandville Silos with hand surgery who  recommended I&D.  Patient will need to be admitted for IV antibiotics.  MRI of the right hand will be ordered.  Blood cultures will be followed up on.  He will be started on IV vancomycin and ceftriaxone.  Lactic acid level was mildly elevated.  Patient was given IV fluid bolus.  Repeat check is normal.  Due to his history of polysubstance abuse he will be treated just with NSAIDs for pain control.  2.  History of polysubstance abuse: He has been off of IV drugs for 6 months.  He gets Suboxone from an unknown source.  Takes 2 mg every day.  We will place him on the same.  He may benefit from being established with a Suboxone clinic at the time of discharge.  3.  Tobacco abuse: Nicotine patch will be ordered.  4.  Alcohol use: Drinks 2 beers a day.  Low risk for withdrawal.  Will be monitored closely for the same.  5.  History of chronic hepatitis C: Patient used to be followed at the ID clinic.  He was supposed to do 12 weeks of Epclusa however he ended up not taking the last course few months ago.  He states that he had misplaced his medications and could not find them.  Check HIV.   DVT Prophylaxis: Lovenox Code Status: Full code Family Communication: Discussed with the patient  Consults called: Dr. Grandville Silos with hand surgery called by the ED provider   Disposition: Status is: Inpatient  Remains inpatient appropriate because:IV treatments appropriate due to intensity of illness or inability to take PO   Dispo: The patient is from: Home              Anticipated d/c is to: Home              Anticipated d/c date is: > 3 days              Patient currently is not medically stable to d/c.     ED providers have ordered the COVID-19 test and is still pending.    Severity of Illness: The appropriate patient status for this patient is INPATIENT. Inpatient status is judged to be reasonable and necessary in order to provide  the required intensity of service to ensure the patient's safety.  The patient's presenting symptoms, physical exam findings, and initial radiographic and laboratory data in the context of their chronic comorbidities is felt to place them at high risk for further clinical deterioration. Furthermore, it is not anticipated that the patient will be medically stable for discharge from the hospital within 2 midnights of admission. The following factors support the patient status of inpatient.   " The patient's presenting symptoms include right hand pain with swelling. " The worrisome physical exam findings include swelling involving the right hand including the thumb. " The initial radiographic and laboratory data are worrisome because of concern for osteomyelitis. " The chronic co-morbidities include hepatitis C.   * I certify that at the point of admission it is my clinical judgment that the patient will require inpatient hospital care spanning beyond 2 midnights from the point of admission due to high intensity of service, high risk for further deterioration and high frequency of surveillance required.*   Further management decisions will depend on results of further testing and patient's response to treatment.   Markayla Reichart [employer]  [facility] on [email or web address]  03/13/2020, 5:48 PM
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
## 2020-03-13 NOTE — ED Notes (Signed)
2 attempts at IV with no success.  IV team order placed.

## 2020-03-13 NOTE — Consult Note (Signed)
ORTHOPAEDIC CONSULTATION HISTORY & PHYSICAL REQUESTING PHYSICIAN: Rolan Bucco, MD  Chief Complaint: Right thumb infection  HPI: Tanner Hoffman is a 29 y.o. male who reportedly sustained a injury at the tip of the right thumb from a box cutter 2 weeks ago.  More recently he has begun to develop some purulent drainage from the distal aspect of the nail, and would pick at the scab, which would then bleed.  He was admitted to the hospital in [facility] yesterday, but ultimately left AMA having only been receiving antibiotics.  He was to be admitted for further treatment given his thumb condition and x-ray findings of possible osteomyelitis of the tip of the phalanx.  Past Medical History:  Diagnosis Date   Abscess of left foot    tx'ed in 2020   Drug abuse, opioid type (HCC)    IV drug abuse (HCC)    Past Surgical History:  Procedure Laterality Date   TONSILLECTOMY     Social History   Socioeconomic History   Marital status: Single    Spouse name: Not on file   Number of children: Not on file   Years of education: Not on file   Highest education level: Not on file  Occupational History   Not on file  Tobacco Use   Smoking status: Current Every Day Smoker    Packs/day: 1.00    Years: 15.00    Pack years: 15.00    Types: Cigarettes   Smokeless tobacco: Never Used  Substance and Sexual Activity   Alcohol use: Yes    Alcohol/week: 10.0 standard drinks    Types: 10 Shots of liquor per week    Comment: 10 shots/day   Drug use: Yes    Types: IV, Methamphetamines, Heroin, MDMA (Ecstacy)    Comment: injecting suboxone from street   Sexual activity: Yes    Partners: Female    Birth control/protection: None  Other Topics Concern   Not on file  Social History Narrative   Not on file   Social Determinants of Health   Financial Resource Strain:    Difficulty of Paying Living Expenses:   Food Insecurity:    Worried About Programme researcher, broadcasting/film/video in the Last Year:    Garment/textile technologist in the Last Year:   Transportation Needs:    Freight forwarder (Medical):    Lack of Transportation (Non-Medical):   Physical Activity:    Days of Exercise per Week:    Minutes of Exercise per Session:   Stress:    Feeling of Stress :   Social Connections:    Frequency of Communication with Friends and Family:    Frequency of Social Gatherings with Friends and Family:    Attends Religious Services:    Active Member of Clubs or Organizations:    Attends Engineer, structural:    Marital Status:    Family History  Problem Relation Age of Onset   COPD Mother    Cancer Father    No Known Allergies Prior to Admission medications   Medication Sig Start Date End Date Taking? Authorizing Provider  acetaminophen (TYLENOL) 500 MG tablet Take 500 mg by mouth every 6 (six) hours as needed for mild pain.   Yes [provider]  buprenorphine-naloxone (SUBOXONE) 2-0.5 mg SUBL SL tablet Place 1 tablet under the tongue daily as needed (For overdose).    Yes [provider]  acetaminophen (TYLENOL) 325 MG tablet Take 2 tablets (650 mg total) by  mouth every 6 (six) hours. Patient not taking: Reported on 10/16/2019 01/03/17   Jill Alexanders, PA-C  naproxen (NAPROSYN) 500 MG tablet Take 1 tablet (500 mg total) by mouth 2 (two) times daily with a meal. Patient not taking: Reported on 10/16/2019 01/01/17   Michael Boston, MD  Sofosbuvir-Velpatasvir (EPCLUSA) 400-100 MG TABS Take 1 tablet by mouth daily. Take 1 tablet by mouth daily. Patient not taking: Reported on 03/13/2020 10/16/19   Golden Circle, FNP   DG Hand Complete Right  Result Date: 03/13/2020 CLINICAL DATA:  Cellulitis of the right thumb secondary to and laceration 1.5 weeks ago. Redness and swelling. Draining pus. EXAM: RIGHT HAND - COMPLETE 3+ VIEW COMPARISON:  None. FINDINGS: There is diffuse soft tissue swelling of the hand and of the entire right thumb. Soft tissue irregularity of the tip of  the thumb. On a single view there is loss of the cortex of the radial aspect of the tuft of the distal phalanx of the thumb which could represent osteomyelitis. IMPRESSION: 1. Diffuse soft tissue swelling of the hand and of the entire right thumb. 2. Soft tissue irregularity at the tip of the thumb. 3. Possible osteomyelitis of the tuft of the distal phalanx of the thumb. Electronically Signed   By: Lorriane Shire M.D.   On: 03/13/2020 15:17    Positive ROS: All other systems have been reviewed and were otherwise negative with the exception of those mentioned in the HPI and as above.  Physical Exam: Vitals: Refer to EMR. Constitutional:  WD, WN, NAD HEENT:  NCAT, EOMI Neuro/Psych:  Alert & oriented to person, place, and time; appropriate mood & affect Lymphatic: No generalized extremity edema or lymphadenopathy Extremities / MSK:  The extremities are normal with respect to appearance, ranges of motion, joint stability, muscle strength/tone, sensation, & perfusion except as otherwise noted:  Clinical photographs are reviewed.  The thumb is swollen and there is fluctuance in the subcutaneous plane dorsally.  The pulp itself is not tense and there is a linear longitudinal wound at the tip of the thumb.  It is my understanding that the ED PA did some kind of little I&D, but there looks as if nothing of significance has been performed.  Assessment: Right thumb infection, with likely subcutaneous pus dorsally, and under the nail matrix  Plan: I discussed these findings with him and recommended bedside surgical drainage, and he consented verbally.  Digital block was performed by me, after which the wound at the tip of the thumb was opened slightly longer and with spreading dissection some additional purulence came forth.  The nail plate was then removed, and this revealed a wound that was draining from the dorsal distal aspect of the nail matrix.  This was continuous with the remainder of the linear  longitudinal wound in the pulp.  Additionally, a 1 inch incision was fashioned dorsally linear longitudinal the region of the IPJ.  Subcutaneous pus was encountered.  Spreading dissection was carried through the subcutaneous plane overlying P1 and IP joint, and this was ultimately left open with a moistened gauze within it.  Both wounds were irrigated copiously before being packed with wet gauze and a dressing applied.  This may be sufficient surgical drainage to treat this infection.  He will obviously need antibiotic treatment for a more protracted time.  Since he is being admitted to begin this, I will go ahead and manage his wound in the hospital, be available for any additional surgical considerations as they  may arise, and address any functional/range of motion issues.  Given the findings on the x-ray, it is possible that this represents early osteomyelitis and he would likely benefit from infectious disease consultation for protracted antibiotic management.  Cliffton Asters Janee Morn, MD      Orthopaedic & Hand Surgery [facility] Orthopaedic & Sports Medicine [facility] [address] [place], [place]  [address] Office: [phone] Mobile: [phone]  03/13/2020, 5:13 PM

## 2020-03-13 NOTE — Plan of Care (Signed)
  Problem: Clinical Measurements: Goal: Diagnostic test results will improve Outcome: Progressing   

## 2020-03-13 NOTE — Progress Notes (Addendum)
Pharmacy Antibiotic Note  Tanner Hoffman is a 29 y.o. male admitted on 03/13/2020 with osteomyelitis.  Pharmacy has been consulted for vancomycin dosing.  Plan: - Vancomycin 1000 mg IV  every 8 hours per nomogram dosing - Monitor clinical status and renal function - Follow-up cultures  Height: 5\' 10"  (177.8 cm) Weight: 61.2 kg (135 lb) IBW/kg (Calculated) : 73  Temp (24hrs), Avg:98.3 F (36.8 C), Min:98.3 F (36.8 C), Max:98.3 F (36.8 C)  Recent Labs  Lab 03/13/20 1435  WBC 10.1  CREATININE 0.55*  LATICACIDVEN 2.3*    Estimated Creatinine Clearance: 116.9 mL/min (A) (by C-G formula based on SCr of 0.55 mg/dL (L)).    No Known Allergies  Antimicrobials this admission: Ceftriaxone 5/28 x1 Vancomycin 5/28 >>  Microbiology results: 5/28 BCx: sent     Tanner Hoffman L. 6/[birth year], PharmD [facility] PGY1 Pharmacy Resident [phone] 03/13/20    4:17 PM  Please check AMION for all [facility] Pharmacy phone numbers After 10:00 PM, call the Main Pharmacy [phone]

## 2020-03-14 ENCOUNTER — Encounter (HOSPITAL_COMMUNITY): Payer: Self-pay | Admitting: Internal Medicine

## 2020-03-14 ENCOUNTER — Inpatient Hospital Stay (HOSPITAL_COMMUNITY): Payer: Self-pay

## 2020-03-14 DIAGNOSIS — W278XXA Contact with other nonpowered hand tool, initial encounter: Secondary | ICD-10-CM

## 2020-03-14 DIAGNOSIS — B9561 Methicillin susceptible Staphylococcus aureus infection as the cause of diseases classified elsewhere: Secondary | ICD-10-CM

## 2020-03-14 DIAGNOSIS — F1990 Other psychoactive substance use, unspecified, uncomplicated: Secondary | ICD-10-CM

## 2020-03-14 DIAGNOSIS — M86141 Other acute osteomyelitis, right hand: Secondary | ICD-10-CM

## 2020-03-14 MED ORDER — GADOBUTROL 1 MMOL/ML IV SOLN
6.0000 mL | Freq: Once | INTRAVENOUS | Status: AC | PRN
  Administered 2020-03-14: 6 mL via INTRAVENOUS

## 2020-03-14 MED ORDER — NICOTINE 14 MG/24HR TD PT24
14.0000 mg | MEDICATED_PATCH | Freq: Every day | TRANSDERMAL | Status: DC
  Administered 2020-03-14 – 2020-03-17 (×4): 14 mg via TRANSDERMAL
  Filled 2020-03-14 (×4): qty 1

## 2020-03-14 NOTE — Progress Notes (Signed)
S/p bedside Incision & drainage in ED 03-13-20  Dressing changes and wound care to begin today. Infection per primary team, may benefit from ID involvement once cultures available to guide tx choices.  Neil Crouch, MD Hand Surgery Mobile [phone]

## 2020-03-14 NOTE — Consult Note (Signed)
Date of Admission:  03/13/2020          Reason for Consult: finger infection with possible osteomyelitis  Referring Provider: Dr. [place]   Assessment:  1. Right Thumb infection with possible early osteomyelitis status post surgery by orthopedics 2. History of soft tissue infections that have undergone I&D before 3. IV drug use reportedly in remission x6 months 4. Hepatitis C without hepatic coma status post 2 but not 3 months of treatment with Epclusa   Plan:  1. Vancomycin at current dose is reasonable along with ceftriaxone that would increase the dose to 2 g daily 2. Standing the undergoing an MRI of the right hand today 3. I would change him to an oral antibiotic regimen once we have more culture data back and once there is clarity about whether he is going back to the operating room again 4. I will check a hepatitis C viral load with tomorrow's labs along with hepatitis A antibodies  Principal Problem:   Cellulitis of right hand Active Problems:   Chronic hepatitis C without hepatic coma (HCC)   Open wound of right thumb   Scheduled Meds: . bacitracin   Topical BID  . buprenorphine-naloxone  1 tablet Sublingual Daily  . multivitamin with minerals  1 tablet Oral Daily  . naproxen  500 mg Oral BID WC  . nicotine  14 mg Transdermal Once   Continuous Infusions: . sodium chloride 75 mL/hr at 03/13/20 2159  . cefTRIAXone (ROCEPHIN)  IV    . vancomycin 1,000 mg (03/14/20 0926)   PRN Meds:.acetaminophen **OR** acetaminophen, ondansetron **OR** ondansetron (ZOFRAN) IV  HPI: Tanner Hoffman is a 29 y.o. male history of injection drug use reportedly in remission who sustained an injury to his right thumb with a box cutter 2 weeks ago while working at his job.  He began develop purulent drainage from the distal aspect of the nail and would pick at the scab which would then bleed.  He was admitted and [place] yesterday but then left AGAINST MEDICAL ADVICE after receiving  some antibiotics.  He is now admitted to [facility].  Plain films show suggestion of osteomyelitis of the tip of the phalanx.  Dr. Janee Morn took him to the operating room yesterday and perform surgery.  I do not see the operative note yet but patient tells me that his nail bed was removed along with some soft tissue.  An MRI is pending.  Gram stain done shows some rare gram-positive cocci and cultures incubating.  Would continue his ceftriaxone and vancomycin.  Await results of MRI and whether he needs further surgeries.  I would give him a protracted course of oral antibiotics likely 6 or 8 weeks.  Hopefully the cultures can help guide therapy.  With regards to his hepatitis C he took 2 months but not the third month of his Dorita Fray because apparently the bottle became lost at his house.  I am highly skeptical that he is abstaining from IV drugs at present I would certainly not feel comfortable with him going home with a PICC line   Review of Systems: Review of Systems  Constitutional: Negative for chills, fever, malaise/fatigue and weight loss.  HENT: Negative for congestion and sore throat.   Eyes: Negative for blurred vision and photophobia.  Respiratory: Negative for cough, shortness of breath and wheezing.   Cardiovascular: Negative for chest pain, palpitations and leg swelling.  Gastrointestinal: Negative for abdominal pain, blood in stool, constipation, diarrhea, heartburn, melena,  nausea and vomiting.  Genitourinary: Negative for dysuria, flank pain and hematuria.  Musculoskeletal: Positive for myalgias. Negative for back pain, falls and joint pain.  Skin: Negative for itching and rash.  Neurological: Negative for dizziness, focal weakness, loss of consciousness, weakness and headaches.  Endo/Heme/Allergies: Does not bruise/bleed easily.  Psychiatric/Behavioral: Negative for depression and suicidal ideas. The patient does not have insomnia.     Past Medical History:    Diagnosis Date  . Abscess of left foot    tx'ed in 2020  . Drug abuse, opioid type (HCC)   . IV drug abuse [facility])     Social History   Tobacco Use  . Smoking status: Current Every Day Smoker    Packs/day: 1.00    Years: 15.00    Pack years: 15.00    Types: Cigarettes  . Smokeless tobacco: Never Used  Substance Use Topics  . Alcohol use: Yes    Alcohol/week: 10.0 standard drinks    Types: 10 Shots of liquor per week    Comment: 10 shots/day  . Drug use: Yes    Types: IV, Methamphetamines, Heroin, MDMA (Ecstacy)    Comment: injecting suboxone from street    Family History  Problem Relation Age of Onset  . COPD Mother   . Cancer Father    No Known Allergies  OBJECTIVE: Blood pressure (!) 148/92, pulse 81, temperature 97.9 F (36.6 C), resp. rate 16, height 5\' 10"  (1.778 m), weight 61.2 kg, SpO2 98 %.  Physical Exam Constitutional:      General: He is not in acute distress.    Appearance: Normal appearance. He is well-developed. He is not ill-appearing or diaphoretic.  HENT:     Head: Normocephalic and atraumatic.     Right Ear: Hearing and external ear normal.     Left Ear: Hearing and external ear normal.     Nose: No nasal deformity or rhinorrhea.  Eyes:     General: No scleral icterus.    Conjunctiva/sclera: Conjunctivae normal.     Right eye: Right conjunctiva is not injected.     Left eye: Left conjunctiva is not injected.     Pupils: Pupils are equal, round, and reactive to light.  Neck:     Vascular: No JVD.  Cardiovascular:     Rate and Rhythm: Normal rate and regular rhythm.     Heart sounds: Normal heart sounds, S1 normal and S2 normal. No murmur.  Pulmonary:     Effort: Pulmonary effort is normal. No respiratory distress.     Breath sounds: No wheezing.  Abdominal:     General: There is no distension.     Palpations: Abdomen is soft.     Tenderness: There is no abdominal tenderness.  Musculoskeletal:     Right shoulder: Normal.     Left  shoulder: Normal.     Cervical back: Normal range of motion and neck supple.     Right hip: Normal.     Left hip: Normal.     Right knee: Normal.     Left knee: Normal.  Lymphadenopathy:     Head:     Right side of head: No submandibular, preauricular or posterior auricular adenopathy.     Left side of head: No submandibular, preauricular or posterior auricular adenopathy.     Cervical: No cervical adenopathy.     Right cervical: No superficial or deep cervical adenopathy.    Left cervical: No superficial or deep cervical adenopathy.  Skin:  General: Skin is warm and dry.     Coloration: Skin is not pale.     Findings: No abrasion, bruising, ecchymosis, erythema, lesion or rash.     Nails: There is no clubbing.  Neurological:     Mental Status: He is alert and oriented to person, place, and time.     Sensory: No sensory deficit.     Coordination: Coordination normal.     Gait: Gait normal.  Psychiatric:        Attention and Perception: He is attentive.        Mood and Affect: Mood normal.        Speech: Speech normal.        Behavior: Behavior normal. Behavior is cooperative.        Thought Content: Thought content normal.        Judgment: Judgment normal.    Right hand swollen and thumb bandaged  Lab Results Lab Results  Component Value Date   WBC 10.1 03/13/2020   HGB 14.6 03/13/2020   HCT 41.9 03/13/2020   MCV 95.2 03/13/2020   PLT 242 03/13/2020    Lab Results  Component Value Date   CREATININE 0.55 (L) 03/13/2020   BUN 11 03/13/2020   NA 137 03/13/2020   K 3.7 03/13/2020   CL 102 03/13/2020   CO2 19 (L) 03/13/2020    Lab Results  Component Value Date   ALT 16 03/13/2020   AST 22 03/13/2020   ALKPHOS 91 03/13/2020   BILITOT 0.8 03/13/2020     Microbiology: Recent Results (from the past 240 hour(s))  Blood culture (routine x 2)     Status: None (Preliminary result)   Collection Time: 03/13/20  2:35 PM   Specimen: BLOOD  Result Value Ref Range  Status   Specimen Description BLOOD BLOOD LEFT FOREARM  Final   Special Requests   Final    BOTTLES DRAWN AEROBIC AND ANAEROBIC Blood Culture results may not be optimal due to an inadequate volume of blood received in culture bottles   Culture   Final    NO GROWTH < 24 HOURS Performed at [facility], [address] [place], [place] [address]    Report Status PENDING  Incomplete  Blood culture (routine x 2)     Status: None (Preliminary result)   Collection Time: 03/13/20  5:12 PM   Specimen: BLOOD LEFT FOREARM  Result Value Ref Range Status   Specimen Description BLOOD LEFT FOREARM  Final   Special Requests   Final    BOTTLES DRAWN AEROBIC AND ANAEROBIC Blood Culture adequate volume   Culture   Final    NO GROWTH < 24 HOURS Performed at [facility], [address], [place], [place] [address]    Report Status PENDING  Incomplete  SARS Coronavirus 2 by RT PCR (hospital order, performed in [facility] hospital lab) Nasopharyngeal Nasopharyngeal Swab     Status: None   Collection Time: 03/13/20  6:39 PM   Specimen: Nasopharyngeal Swab  Result Value Ref Range Status   SARS Coronavirus 2 NEGATIVE NEGATIVE Final    Comment: (NOTE) SARS-CoV-2 target nucleic acids are NOT DETECTED. The SARS-CoV-2 RNA is generally detectable in upper and lower respiratory specimens during the acute phase of infection. The lowest concentration of SARS-CoV-2 viral copies this assay can detect is 250 copies / mL. A negative result does not preclude SARS-CoV-2 infection and should not be used as the sole basis for treatment or other  patient management decisions.  A negative result may occur with improper specimen collection / handling, submission of specimen other than nasopharyngeal swab, presence of viral mutation(s) within the areas targeted by this assay, and inadequate number of viral copies (<250 copies / mL). A negative result must be combined with clinical observations, patient history,  and epidemiological information. Fact Sheet for Patients:   StrictlyIdeas.no Fact Sheet for Healthcare Providers: BankingDealers.co.za This test is not yet approved or cleared  by the [place] FDA and has been authorized for detection and/or diagnosis of SARS-CoV-2 by FDA under an Emergency Use Authorization (EUA).  This EUA will remain in effect (meaning this test can be used) for the duration of the COVID-19 declaration under Section 564(b)(1) of the Act, 21 U.S.C. section 360bbb-3(b)(1), unless the authorization is terminated or revoked sooner. Performed at [facility], [place] [address], [place], [place] [address]   Aerobic Culture (superficial specimen)     Status: None (Preliminary result)   Collection Time: 03/13/20  8:57 PM   Specimen: Wound  Result Value Ref Range Status   Specimen Description WOUND THUMB RIGHT  Final   Special Requests NONE  Final   Gram Stain   Final    NO WBC SEEN RARE GRAM POSITIVE COCCI Performed at [facility], [address], [place], [place] [address]    Culture PENDING  Incomplete   Report Status PENDING  Incomplete    Alcide Evener, MD [facility] [phone] pager  03/14/2020, 11:19 AM

## 2020-03-14 NOTE — Progress Notes (Signed)
PROGRESS NOTE    Tanner Hoffman  PRF:[id number] DOB: 02-26-1991 DOA: 03/13/2020 PCP: Patient, No Pcp Per    Brief Narrative:  Tanner Hoffman is a 29 y.o. male with a past medical history of polysubstance abuse, hepatitis C who was in his usual state of health till about a week and a half ago when he sustained cut to the right thumb near the tip of the thumb.  He has had some pain in that area with some redness.  He was picking that site several days.  The swelling and the redness got worse.  He started noticing redness involving the entire hand and saw streaks running up his forearm.  He went to the ED in hospital in [place].  However he decided to leave AGAINST MEDICAL ADVICE.  Today he decided to come here for further treatment.  Pain is currently 6 out of 10 in intensity but mainly when he is moving his hand and his thumb.  Denies any fever or chills at home.  No nausea or vomiting.  Denies lesions in any other part of his body.  He mentions that he has not done any IV drug use in the last 6 months.  He says that he gets Suboxone from an unknown source and takes 1 tablet daily, 2 mg each.  He says that he has not been able to establish at a Suboxone clinic due to lack of insurance.  In the emergency department patient was noted to have significant swelling involving his right hand especially his right thumb.  Erythematous streaks were noted going up the forearm.  He has WBC was normal.  His lactic acid was mildly elevated.  Discussions were held with Tanner Hoffman with hand surgery.  Patient underwent x-ray which raised concern for osteomyelitis involving the distal phalanx.  Patient will need hospitalization for further management.   Assessment & Plan:   Principal Problem:   Cellulitis of right hand Active Problems:   Chronic hepatitis C without hepatic coma (HCC)   Open wound of right thumb   Cellulitis right thumb/hand with concern for osteomyelitis Patient presenting with  progressive pain/redness to right hand/thumb following injury roughly 2 weeks prior.  WBC count 10.1 with a lactic acid of 2.3.  Was seen by hand surgery, Tanner Hoffman and underwent bedside surgical drainage on 03/13/2020. --Wound culture with rare gram-positive cocci, further identification/susceptibilities pending --Blood cultures x2: No growth less than 24 hours --Lactic acid 2.3>1.8>1.6 --MR right hand: Pending --Continue empiric antibiotics with vancomycin/ceftriaxone --ID consult for assistance with antibiotic regimen and duration --Continue wound care per orthopedics --Supportive care, pain control  History of polysubstance abuse/IVDU Patient denies any recent IV drug use, last reported use 6 months ago.  Currently gets Suboxone from a unknown source in which he takes 2 mg p.o. daily. --Suboxone 2 mg p.o. daily --TOC consult to assist establish care with Suboxone clinic  Tobacco use disorder: Nicotine patch  EtOH use: Reports drinks 2 beers a day. --Monitor for withdrawal symptoms  Hx chronic hepatitis C Was previously followed at the ID clinic.  Was on 12 weeks of Epclusa; but apparently did not finish treatment course few months ago as he reports misplaces medications.    DVT prophylaxis: Lovenox Code Status: Full code Family Communication: Discussed with patient extensively at bedside  Disposition Plan:  Status is: Inpatient  Remains inpatient appropriate because:Ongoing active pain requiring inpatient pain management, Ongoing diagnostic testing needed not appropriate for outpatient work up and IV treatments appropriate  due to intensity of illness or inability to take PO   Dispo: The patient is from: Home              Anticipated d/c is to: Home              Anticipated d/c date is: 3 days              Patient currently is not medically stable to d/c.   Consultants:   Orthopedic hand surgery, Dr. Grandville Silos  Infectious disease - Dr. Tommy Medal  Procedures:    Bedside incision and drainage, Dr. Grandville Silos 03/13/2020  Antimicrobials:   Vancomycin 5/28>>  Ceftriaxone 5/28>>   Subjective: Patient seen and examined bedside, resting comfortably.  Pain controlled.  Awaiting MR right hand.  Underwent bedside incision and drainage by orthopedics yesterday.  Hand/forearm currently wrapped.  No other specific complaints or concerns at this time.  Denies headache, no fever/chills/night sweats, no nausea/vomiting/diarrhea, no chest pain, no palpitations, no shortness of breath, no abdominal pain.  No acute events overnight per nursing staff.  Objective: Vitals:   03/13/20 1932 03/13/20 2100 03/14/20 0351 03/14/20 0740  BP: (!) 139/100 (!) 156/97 (!) 129/94 (!) 148/92  Pulse: 70 100 88 81  Resp: 16 16 16 16   Temp: 98.8 F (37.1 C) 97.7 F (36.5 C) 98.7 F (37.1 C) 97.9 F (36.6 C)  TempSrc: Oral Oral Oral   SpO2: 100% 96% 94% 98%  Weight:      Height:        Intake/Output Summary (Last 24 hours) at 03/14/2020 1012 Last data filed at 03/14/2020 0809 Gross per 24 hour  Intake 0 ml  Output --  Net 0 ml   Filed Weights   03/13/20 1412  Weight: 61.2 kg    Examination:  General exam: Appears calm and comfortable  Respiratory system: Clear to auscultation. Respiratory effort normal. Cardiovascular system: S1 & S2 heard, RRR. No JVD, murmurs, rubs, gallops or clicks. No pedal edema. Gastrointestinal system: Abdomen is nondistended, soft and nontender. No organomegaly or masses felt. Normal bowel sounds heard. Central nervous system: Alert and oriented. No focal neurological deficits. Extremities: Symmetric 5 x 5 power.  Right hand with ACE wrap/dressing in place, clean/dry/intact Skin: No rashes, lesions or ulcers Psychiatry: Judgement and insight appear normal. Mood & affect appropriate.     Data Reviewed: I have personally reviewed following labs and imaging studies  CBC: Recent Labs  Lab 03/13/20 1435  WBC 10.1  NEUTROABS 7.3   HGB 14.6  HCT 41.9  MCV 95.2  PLT 443   Basic Metabolic Panel: Recent Labs  Lab 03/13/20 1435  NA 137  K 3.7  CL 102  CO2 19*  GLUCOSE 95  BUN 11  CREATININE 0.55*  CALCIUM 9.3   GFR: Estimated Creatinine Clearance: 116.9 mL/min (A) (by C-G formula based on SCr of 0.55 mg/dL (L)). Liver Function Tests: Recent Labs  Lab 03/13/20 1435  AST 22  ALT 16  ALKPHOS 91  BILITOT 0.8  PROT 7.8  ALBUMIN 3.7   No results for input(s): LIPASE, AMYLASE in the last 168 hours. No results for input(s): AMMONIA in the last 168 hours. Coagulation Profile: No results for input(s): INR, PROTIME in the last 168 hours. Cardiac Enzymes: No results for input(s): CKTOTAL, CKMB, CKMBINDEX, TROPONINI in the last 168 hours. BNP (last 3 results) No results for input(s): PROBNP in the last 8760 hours. HbA1C: No results for input(s): HGBA1C in the last 72 hours. CBG: No  results for input(s): GLUCAP in the last 168 hours. Lipid Profile: No results for input(s): CHOL, HDL, LDLCALC, TRIG, CHOLHDL, LDLDIRECT in the last 72 hours. Thyroid Function Tests: No results for input(s): TSH, T4TOTAL, FREET4, T3FREE, THYROIDAB in the last 72 hours. Anemia Panel: No results for input(s): VITAMINB12, FOLATE, FERRITIN, TIBC, IRON, RETICCTPCT in the last 72 hours. Sepsis Labs: Recent Labs  Lab 03/13/20 1435 03/13/20 1712 03/13/20 2124  LATICACIDVEN 2.3* 1.8 1.6    Recent Results (from the past 240 hour(s))  Blood culture (routine x 2)     Status: None (Preliminary result)   Collection Time: 03/13/20  2:35 PM   Specimen: BLOOD  Result Value Ref Range Status   Specimen Description BLOOD BLOOD LEFT FOREARM  Final   Special Requests   Final    BOTTLES DRAWN AEROBIC AND ANAEROBIC Blood Culture results may not be optimal due to an inadequate volume of blood received in culture bottles   Culture   Final    NO GROWTH < 24 HOURS Performed at [facility], [address], [place], [place] [address]     Report Status PENDING  Incomplete  Blood culture (routine x 2)     Status: None (Preliminary result)   Collection Time: 03/13/20  5:12 PM   Specimen: BLOOD LEFT FOREARM  Result Value Ref Range Status   Specimen Description BLOOD LEFT FOREARM  Final   Special Requests   Final    BOTTLES DRAWN AEROBIC AND ANAEROBIC Blood Culture adequate volume   Culture   Final    NO GROWTH < 24 HOURS Performed at [facility], [address], [place], [place] [address]    Report Status PENDING  Incomplete  SARS Coronavirus 2 by RT PCR (hospital order, performed in [facility] hospital lab) Nasopharyngeal Nasopharyngeal Swab     Status: None   Collection Time: 03/13/20  6:39 PM   Specimen: Nasopharyngeal Swab  Result Value Ref Range Status   SARS Coronavirus 2 NEGATIVE NEGATIVE Final    Comment: (NOTE) SARS-CoV-2 target nucleic acids are NOT DETECTED. The SARS-CoV-2 RNA is generally detectable in upper and lower respiratory specimens during the acute phase of infection. The lowest concentration of SARS-CoV-2 viral copies this assay can detect is 250 copies / mL. A negative result does not preclude SARS-CoV-2 infection and should not be used as the sole basis for treatment or other patient management decisions.  A negative result may occur with improper specimen collection / handling, submission of specimen other than nasopharyngeal swab, presence of viral mutation(s) within the areas targeted by this assay, and inadequate number of viral copies (<250 copies / mL). A negative result must be combined with clinical observations, patient history, and epidemiological information. Fact Sheet for Patients:   BoilerBrush.com.cy Fact Sheet for Healthcare Providers: https://pope.com/ This test is not yet approved or cleared  by the [place] FDA and has been authorized for detection and/or diagnosis of SARS-CoV-2 by FDA under an Emergency Use  Authorization (EUA).  This EUA will remain in effect (meaning this test can be used) for the duration of the COVID-19 declaration under Section 564(b)(1) of the Act, 21 U.S.C. section 360bbb-3(b)(1), unless the authorization is terminated or revoked sooner. Performed at [facility] Lab, [address], [place], [place] [address]   Aerobic Culture (superficial specimen)     Status: None (Preliminary result)   Collection Time: 03/13/20  8:57 PM   Specimen: Wound  Result Value Ref Range Status  Specimen Description WOUND THUMB RIGHT  Final   Special Requests NONE  Final   Gram Stain   Final    NO WBC SEEN RARE GRAM POSITIVE COCCI Performed at [facility] Lab, [address] [place], [place] [address]    Culture PENDING  Incomplete   Report Status PENDING  Incomplete      Radiology Studies: DG Hand Complete Right  Result Date: 03/13/2020 CLINICAL DATA:  Cellulitis of the right thumb secondary to and laceration 1.5 weeks ago. Redness and swelling. Draining pus. EXAM: RIGHT HAND - COMPLETE 3+ VIEW COMPARISON:  None. FINDINGS: There is diffuse soft tissue swelling of the hand and of the entire right thumb. Soft tissue irregularity of the tip of the thumb. On a single view there is loss of the cortex of the radial aspect of the tuft of the distal phalanx of the thumb which could represent osteomyelitis. IMPRESSION: 1. Diffuse soft tissue swelling of the hand and of the entire right thumb. 2. Soft tissue irregularity at the tip of the thumb. 3. Possible osteomyelitis of the tuft of the distal phalanx of the thumb. Electronically Signed   By: Francene Boyers M.D.   On: 03/13/2020 15:17     Scheduled Meds: . bacitracin   Topical BID  . buprenorphine-naloxone  1 tablet Sublingual Daily  . enoxaparin (LOVENOX) injection  40 mg Subcutaneous Q24H  . multivitamin with minerals  1 tablet Oral Daily  . naproxen  500 mg Oral BID WC  . nicotine  14 mg Transdermal Once   Continuous  Infusions: . sodium chloride 75 mL/hr at 03/13/20 2159  . cefTRIAXone (ROCEPHIN)  IV    . vancomycin 1,000 mg (03/14/20 0926)     LOS: 1 day    Time spent: 36 minutes spent on chart review, discussion with nursing staff, consultants, updating family and interview/physical exam; more than 50% of that time was spent in counseling and/or coordination of care.    Alvira Philips [place], DO [facility] Available via Epic secure chat 7am-7pm After these hours, please refer to coverage provider listed on [email or web address] 03/14/2020, 10:12 AM

## 2020-03-14 NOTE — Progress Notes (Signed)
Dressing changed- old serosanquinous drainage.

## 2020-03-15 DIAGNOSIS — L02511 Cutaneous abscess of right hand: Secondary | ICD-10-CM | POA: Diagnosis present

## 2020-03-15 LAB — AEROBIC CULTURE W GRAM STAIN (SUPERFICIAL SPECIMEN): Gram Stain: NONE SEEN

## 2020-03-15 LAB — COMPREHENSIVE METABOLIC PANEL
ALT: 11 U/L (ref 0–44)
AST: 33 U/L (ref 15–41)
Albumin: 3 g/dL — ABNORMAL LOW (ref 3.5–5.0)
Alkaline Phosphatase: 88 U/L (ref 38–126)
Anion gap: 9 (ref 5–15)
BUN: 11 mg/dL (ref 6–20)
CO2: 22 mmol/L (ref 22–32)
Calcium: 8.9 mg/dL (ref 8.9–10.3)
Chloride: 109 mmol/L (ref 98–111)
Creatinine, Ser: 0.61 mg/dL (ref 0.61–1.24)
GFR calc Af Amer: >60 mL/min (ref >60)
GFR calc non Af Amer: >60 mL/min (ref >60)
Glucose, Bld: 102 mg/dL — ABNORMAL HIGH (ref 70–99)
Potassium: 5.1 mmol/L (ref 3.5–5.1)
Sodium: 140 mmol/L (ref 135–145)
Total Bilirubin: 1.4 mg/dL — ABNORMAL HIGH (ref 0.3–1.2)
Total Protein: 5.9 g/dL — ABNORMAL LOW (ref 6.5–8.1)

## 2020-03-15 LAB — CBC
HCT: 41.6 % (ref 39.0–52.0)
Hemoglobin: 14 g/dL (ref 13.0–17.0)
MCH: 32.9 pg (ref 26.0–34.0)
MCHC: 33.7 g/dL (ref 30.0–36.0)
MCV: 97.7 fL (ref 80.0–100.0)
Platelets: 210 10*3/uL (ref 150–400)
RBC: 4.26 MIL/uL (ref 4.22–5.81)
RDW: 12.1 % (ref 11.5–15.5)
WBC: 4.7 10*3/uL (ref 4.0–10.5)
nRBC: 0 % (ref 0.0–0.2)

## 2020-03-15 LAB — HIV ANTIBODY (ROUTINE TESTING W REFLEX): HIV Screen 4th Generation wRfx: NONREACTIVE

## 2020-03-15 LAB — HEPATITIS A ANTIBODY, TOTAL: hep A Total Ab: NONREACTIVE

## 2020-03-15 LAB — C-REACTIVE PROTEIN: CRP: 3.9 mg/dL — ABNORMAL HIGH (ref <1.0)

## 2020-03-15 LAB — SEDIMENTATION RATE: Sed Rate: 25 mm/hr — ABNORMAL HIGH (ref 0–16)

## 2020-03-15 MED ORDER — CEFAZOLIN SODIUM-DEXTROSE 2-4 GM/100ML-% IV SOLN
2.0000 g | Freq: Three times a day (TID) | INTRAVENOUS | Status: DC
  Administered 2020-03-15 – 2020-03-17 (×6): 2 g via INTRAVENOUS
  Filled 2020-03-15 (×6): qty 100

## 2020-03-15 NOTE — Plan of Care (Signed)
  Problem: Health Behavior/Discharge Planning: Goal: Ability to manage health-related needs will improve Outcome: Progressing   Problem: Pain Managment: Goal: General experience of comfort will improve Outcome: Progressing   

## 2020-03-15 NOTE — Progress Notes (Signed)
5-28 ED Bedside incision/drainage & nailplate removal Cx = OSSA Wound care initiated yesterday MRI c/w entire distal phalanx osteomyelitis by report  Given his youthfulness, more recent onset of sx, and the digit involved (thumb), it would be nice if this problem could resolve without more aggressive surgical management.  I remain available and happy to provide additional drainage and/or debridement as may be deemed necessary as we try to work through this without amputation.  If amputation is ultimately necessary, it would be though level of IPJ, which would obviously impact overall thumb and hand function.  Neil Crouch, MD [facility] [phone]

## 2020-03-15 NOTE — Progress Notes (Addendum)
Subjective: No new complaints   Antibiotics:  Anti-infectives (From admission, onward)   Start     Dose/Rate Route Frequency Ordered Stop   03/15/20 1400  ceFAZolin (ANCEF) IVPB 2g/100 mL premix     2 g 200 mL/hr over 30 Minutes Intravenous Every 8 hours 03/15/20 1100     03/14/20 1000  cefTRIAXone (ROCEPHIN) 2 g in sodium chloride 0.9 % 100 mL IVPB  Status:  Discontinued     2 g 200 mL/hr over 30 Minutes Intravenous Every 24 hours 03/13/20 2059 03/15/20 1032   03/13/20 1700  vancomycin (VANCOCIN) IVPB 1000 mg/200 mL premix  Status:  Discontinued     1,000 mg 200 mL/hr over 60 Minutes Intravenous Every 8 hours 03/13/20 1619 03/15/20 1151   03/13/20 1615  cefTRIAXone (ROCEPHIN) 2 g in sodium chloride 0.9 % 100 mL IVPB     2 g 200 mL/hr over 30 Minutes Intravenous  Once 03/13/20 1612 03/13/20 2003      Medications: Scheduled Meds: . bacitracin   Topical BID  . buprenorphine-naloxone  1 tablet Sublingual Daily  . multivitamin with minerals  1 tablet Oral Daily  . naproxen  500 mg Oral BID WC  . nicotine  14 mg Transdermal Daily   Continuous Infusions: . sodium chloride 75 mL/hr at 03/13/20 2159  .  ceFAZolin (ANCEF) IV     PRN Meds:.acetaminophen **OR** acetaminophen, ondansetron **OR** ondansetron (ZOFRAN) IV    Objective: Weight change:   Intake/Output Summary (Last 24 hours) at 03/15/2020 1336 Last data filed at 03/15/2020 0450 Gross per 24 hour  Intake 1417.4 ml  Output 850 ml  Net 567.4 ml   Blood pressure 116/83, pulse 99, temperature 97.9 F (36.6 C), temperature source Oral, resp. rate 18, height 5\' 10"  (1.778 m), weight 61.2 kg, SpO2 95 %. Temp:  [97.9 F (36.6 C)-98.6 F (37 C)] 97.9 F (36.6 C) (05/30 0906) Pulse Rate:  [99-104] 99 (05/30 0906) Resp:  [18] 18 (05/30 0906) BP: (116-123)/(74-83) 116/83 (05/30 0906) SpO2:  [95 %-100 %] 95 % (05/30 0906)  Physical Exam: General: Alert and awake, oriented x3, not in any acute  distress. HEENT: anicteric sclera, EOMI CVS regular rate, normal  Chest: , no wheezing, no respiratory distress Abdomen: soft non-distended,  Thumb is bandaged Skin: no rashes Neuro: nonfocal  CBC:    BMET Recent Labs    03/13/20 1435 03/15/20 0401  NA 137 140  K 3.7 5.1  CL 102 109  CO2 19* 22  GLUCOSE 95 102*  BUN 11 11  CREATININE 0.55* 0.61  CALCIUM 9.3 8.9     Liver Panel  Recent Labs    03/13/20 1435 03/15/20 0401  PROT 7.8 5.9*  ALBUMIN 3.7 3.0*  AST 22 33  ALT 16 11  ALKPHOS 91 88  BILITOT 0.8 1.4*       Sedimentation Rate Recent Labs    03/15/20 0401  ESRSEDRATE 25*   C-Reactive Protein Recent Labs    03/15/20 0401  CRP 3.9*    Micro Results: Recent Results (from the past 720 hour(s))  Blood culture (routine x 2)     Status: None (Preliminary result)   Collection Time: 03/13/20  2:35 PM   Specimen: BLOOD  Result Value Ref Range Status   Specimen Description BLOOD BLOOD LEFT FOREARM  Final   Special Requests   Final    BOTTLES DRAWN AEROBIC AND ANAEROBIC Blood Culture results may not be optimal due to an  inadequate volume of blood received in culture bottles   Culture   Final    NO GROWTH 2 DAYS Performed at [facility], [address] [place], [place] [address]    Report Status PENDING  Incomplete  Blood culture (routine x 2)     Status: None (Preliminary result)   Collection Time: 03/13/20  5:12 PM   Specimen: BLOOD LEFT FOREARM  Result Value Ref Range Status   Specimen Description BLOOD LEFT FOREARM  Final   Special Requests   Final    BOTTLES DRAWN AEROBIC AND ANAEROBIC Blood Culture adequate volume   Culture   Final    NO GROWTH 2 DAYS Performed at [facility], [address], [place], [place] [address]    Report Status PENDING  Incomplete  SARS Coronavirus 2 by RT PCR (hospital order, performed in [facility] hospital lab) Nasopharyngeal Nasopharyngeal Swab     Status: None   Collection Time: 03/13/20   6:39 PM   Specimen: Nasopharyngeal Swab  Result Value Ref Range Status   SARS Coronavirus 2 NEGATIVE NEGATIVE Final    Comment: (NOTE) SARS-CoV-2 target nucleic acids are NOT DETECTED. The SARS-CoV-2 RNA is generally detectable in upper and lower respiratory specimens during the acute phase of infection. The lowest concentration of SARS-CoV-2 viral copies this assay can detect is 250 copies / mL. A negative result does not preclude SARS-CoV-2 infection and should not be used as the sole basis for treatment or other patient management decisions.  A negative result may occur with improper specimen collection / handling, submission of specimen other than nasopharyngeal swab, presence of viral mutation(s) within the areas targeted by this assay, and inadequate number of viral copies (<250 copies / mL). A negative result must be combined with clinical observations, patient history, and epidemiological information. Fact Sheet for Patients:   BoilerBrush.com.cy Fact Sheet for Healthcare Providers: https://pope.com/ This test is not yet approved or cleared  by the [place] FDA and has been authorized for detection and/or diagnosis of SARS-CoV-2 by FDA under an Emergency Use Authorization (EUA).  This EUA will remain in effect (meaning this test can be used) for the duration of the COVID-19 declaration under Section 564(b)(1) of the Act, 21 U.S.C. section 360bbb-3(b)(1), unless the authorization is terminated or revoked sooner. Performed at [facility], [address] [place], [place] [address]   Aerobic Culture (superficial specimen)     Status: None   Collection Time: 03/13/20  8:57 PM   Specimen: Wound  Result Value Ref Range Status   Specimen Description WOUND THUMB RIGHT  Final   Special Requests NONE  Final   Gram Stain   Final    NO WBC SEEN RARE GRAM POSITIVE COCCI Performed at [facility], [address] [place], [place] [address]    Culture MODERATE STAPHYLOCOCCUS AUREUS  Final   Report Status 03/15/2020 FINAL  Final   Organism ID, Bacteria STAPHYLOCOCCUS AUREUS  Final      Susceptibility   Staphylococcus aureus - MIC*    CIPROFLOXACIN >=8 RESISTANT Resistant     ERYTHROMYCIN >=8 RESISTANT Resistant     GENTAMICIN <=0.5 SENSITIVE Sensitive     OXACILLIN <=0.25 SENSITIVE Sensitive     TETRACYCLINE <=1 SENSITIVE Sensitive     VANCOMYCIN 1 SENSITIVE Sensitive     TRIMETH/SULFA <=10 SENSITIVE Sensitive     CLINDAMYCIN <=0.25 SENSITIVE Sensitive     RIFAMPIN <=0.5 SENSITIVE Sensitive  Inducible Clindamycin NEGATIVE Sensitive     * MODERATE STAPHYLOCOCCUS AUREUS    Studies/Results: MR HAND RIGHT W WO CONTRAST  Result Date: 03/14/2020 CLINICAL DATA:  Cellulitis and swelling of the right hand secondary to a laceration of the tip of the right thumb. Abnormal radiographs dated 03/13/2020 with possible erosion of the tuft of the distal phalanx of the thumb. EXAM: MRI OF THE RIGHT HAND WITHOUT AND WITH CONTRAST TECHNIQUE: Multiplanar, multisequence MR imaging of the right hand was performed before and after the administration of intravenous contrast. CONTRAST:  58mL GADAVIST GADOBUTROL 1 MMOL/ML IV SOLN COMPARISON:  Radiographs dated 03/13/2020 FINDINGS: Bones/Joint/Cartilage There is osteomyelitis involving the entire distal phalanx of right thumb. The other bones of the right hand appear normal. No joint effusions. Ligaments Normal. Muscles and Tendons Normal. Soft tissues There is diffuse edema of the soft tissues of the hand including muscles of the thenar eminence. There is a focal area of what appears to be devitalized soft tissue overlying the dorsal aspect of the IP joint of the thumb best seen on image 38 of series 15 and image 21 of series 14. There is loss of soft tissue at the dorsal aspect of the tip of the thumb. No definable soft tissue abscesses. IMPRESSION: 1. Osteomyelitis involving  the entire distal phalanx of the right thumb. 2. Focal area of devitalized soft tissue overlying the dorsal aspect of the IP joint of the thumb. 3. Diffuse edema of the soft tissues of the hand including muscles of thenar eminence consistent with cellulitis and myositis. No discrete soft tissue abscesses. Electronically Signed   By: Lorriane Shire M.D.   On: 03/14/2020 13:33   DG Hand Complete Right  Result Date: 03/13/2020 CLINICAL DATA:  Cellulitis of the right thumb secondary to and laceration 1.5 weeks ago. Redness and swelling. Draining pus. EXAM: RIGHT HAND - COMPLETE 3+ VIEW COMPARISON:  None. FINDINGS: There is diffuse soft tissue swelling of the hand and of the entire right thumb. Soft tissue irregularity of the tip of the thumb. On a single view there is loss of the cortex of the radial aspect of the tuft of the distal phalanx of the thumb which could represent osteomyelitis. IMPRESSION: 1. Diffuse soft tissue swelling of the hand and of the entire right thumb. 2. Soft tissue irregularity at the tip of the thumb. 3. Possible osteomyelitis of the tuft of the distal phalanx of the thumb. Electronically Signed   By: Lorriane Shire M.D.   On: 03/13/2020 15:17      Assessment/Plan:  INTERVAL HISTORY: Osteomyelitis of the entire distal phalanx of the right thumb with focal devitalized tissue over the dorsal aspect of IP joint   Cultures have yielded methicillin sensitive staph coccus aureus  Principal Problem:   Cellulitis of right hand Active Problems:   Chronic hepatitis C without hepatic coma (HCC)   Open wound of right thumb    Tanner Hoffman is a 29 y.o. male with history of IV drug use and chronic hepatitis C without hepatic coma status post 2 months of Epclusa, who missed third month now admitted with hand infection with osteomyelitis involving right thumb status post surgery by Dr. Grandville Silos.  Cultures have yielded MSSA.  --narrow to cefazolin --at DC would provide him with 8  weeks of oral augmentin 875/125 mg BID\   HC virus chronic without coma: rechecking VL to see if cured it with suboptimal course, may need retreatment.  Antonious Ashok Norris has an VIDEO appt  with me on 04/01/2020 at 10PM via Mychart   We will reach out to him via mychart and or phone 15 minutes prior to his visit  I will otherwise sign off   Please call with further questions.       LOS: 2 days   Acey Lav 03/15/2020, 1:36 PM

## 2020-03-15 NOTE — Progress Notes (Addendum)
Pharmacy Antibiotic Note  Tanner Hoffman is a 29 y.o. male admitted on 03/13/2020 with osteomyelitis of right thumb. Pt received bedside I&D on 5/28 and was started on vancomycin and ceftriaxone. Wound culture growing Staph aureus. Ceftriaxone has been discontinued. Pharmacy has been consulted for cefazolin dosing.  Patient is afebrile, WBC wnl. Renal function stable.  Plan: - Cefazolin 2 gm IV q8h - Continue vancomycin 100 mg IV q8h, will obtain vanc trough tomorrow - F/u cultures, clinical status, and renal function  - F/u surgery plans and transition to PO abx when able  Addendum 11:56 AM : Wound cx resulted with MSSA. Vancomycin has been discontinued. No surgery plans per hand surgery.  Height: 5\' 10"  (177.8 cm) Weight: 61.2 kg (135 lb) IBW/kg (Calculated) : 73  Temp (24hrs), Avg:98.1 F (36.7 C), Min:97.7 F (36.5 C), Max:98.6 F (37 C)  Recent Labs  Lab 03/13/20 1435 03/13/20 1712 03/13/20 2124 03/15/20 0401  WBC 10.1  --   --  4.7  CREATININE 0.55*  --   --  0.61  LATICACIDVEN 2.3* 1.8 1.6  --     Estimated Creatinine Clearance: 116.9 mL/min (by C-G formula based on SCr of 0.61 mg/dL).    No Known Allergies  Antimicrobials this admission: Ceftriaxone 5/28 >> 5/29 Vancomycin 5/28 >> Cefazolin 5/30 >>  Microbiology results: 5/28 BCx:  NG2D 5/28 Wound cx: moderate Staph aureus, susceptibility to follow>>MSSA  6/28, PharmD PGY1 Pharmacy Resident  Please check AMION for all [facility] Pharmacy phone numbers After 10:00 PM, call Main Pharmacy [phone]  03/15/2020 10:59 AM

## 2020-03-15 NOTE — Progress Notes (Signed)
PROGRESS NOTE    [place] GROSSER  SFK:[id number] DOB: [birth year] DOA: 03/13/2020 PCP: Patient, No Pcp Per    Brief Narrative:  Tanner Hoffman is a 29 y.o. male with a past medical history of polysubstance abuse, hepatitis C who was in his usual state of health till about a week and a half ago when he sustained cut to the right thumb near the tip of the thumb.  He has had some pain in that area with some redness.  He was picking that site several days.  The swelling and the redness got worse.  He started noticing redness involving the entire hand and saw streaks running up his forearm.  He went to the ED in hospital in [place].  However he decided to leave [place].  Today he decided to come here for further treatment.  Pain is currently 6 out of 10 in intensity but mainly when he is moving his hand and his thumb.  Denies any fever or chills at home.  No nausea or vomiting.  Denies lesions in any other part of his body.  He mentions that he has not done any IV drug use in the last 6 months.  He says that he gets Suboxone from an unknown source and takes 1 tablet daily, 2 mg each.  He says that he has not been able to establish at a Suboxone clinic due to lack of insurance.  In the emergency department patient was noted to have significant swelling involving his right hand especially his right thumb.  Erythematous streaks were noted going up the forearm.  He has WBC was normal.  His lactic acid was mildly elevated.  Discussions were held with Dr. Grandville Silos with hand surgery.  Patient underwent x-ray which raised concern for osteomyelitis involving the distal phalanx.  Patient will need hospitalization for further management.   Assessment & Plan:   Principal Problem:   Cellulitis of right hand Active Problems:   Chronic hepatitis C without hepatic coma (HCC)   Open wound of right thumb   Right hand cellulitis/myositis Right thumb osteomyelitis Patient presenting with  progressive pain/redness to right hand/thumb following injury roughly 2 weeks prior.  WBC count 10.1 with a lactic acid of 2.3.  ESR 25, CRP 3.9.  Underwent bedside surgical drainage on 03/13/2020 by orthopedics hand surgery, Dr. Grandville Silos.  MR right hand with osteomyelitis entire distal phalanx right thumb with diffuse edema/soft tissue edema hand and muscles consistent with cellulitis/myositis.  Wound culture with MSSA. --Blood cultures x2: No growth x 2 days --Continue empiric antibiotics with vancomycin/cefazolin --ID following for assistance with antibiotic regimen and duration --Awaiting further recommendations from orthopedics --Supportive care, pain control  History of polysubstance abuse/IVDU Patient denies any recent IV drug use, last reported use 6 months ago.  Currently gets Suboxone from a unknown source in which he takes 2 mg p.o. daily. --Suboxone 2 mg p.o. daily --TOC consult to assist establish care with Suboxone clinic  Tobacco use disorder: Nicotine patch  EtOH use: Reports drinks 2 beers a day. --Monitor for withdrawal symptoms  Hx chronic hepatitis C Was previously followed at the ID clinic.  Was on 12 weeks of Epclusa; but apparently did not finish treatment course few months ago as he reports misplaces medications. --HCV RNA quantitative: Pending    DVT prophylaxis: Lovenox Code Status: Full code Family Communication: Discussed with patient extensively at bedside  Disposition Plan:  Status is: Inpatient  Remains inpatient appropriate because:Ongoing active pain requiring inpatient  pain management, Ongoing diagnostic testing needed not appropriate for outpatient work up and IV treatments appropriate due to intensity of illness or inability to take PO   Dispo: The patient is from: Home              Anticipated d/c is to: Home              Anticipated d/c date is: 3 days              Patient currently is not medically stable to d/c.   Consultants:    Orthopedic hand surgery, Dr. Grandville Silos  Infectious disease - Dr. Tommy Medal  Procedures:   Bedside incision and drainage, Dr. Grandville Silos 03/13/2020  Antimicrobials:   Vancomycin 5/28>>  Cefazolin 5/30>>  Ceftriaxone 5/28 - 5/29  Subjective: Patient seen and examined bedside, resting comfortably.  States pain is controlled.  Concerned about his thumb and bone infection.  Awaiting further recommendations from orthopedics and ID.  No other specific complaints or concerns at this time.  Denies headache, no fever/chills/night sweats, no nausea/vomiting/diarrhea, no chest pain, no palpitations, no shortness of breath, no abdominal pain.  No acute events overnight per nursing staff.  Objective: Vitals:   03/14/20 1328 03/14/20 2047 03/15/20 0450 03/15/20 0906  BP: (!) 131/97 123/74 119/74 116/83  Pulse: 82 (!) 104 100 99  Resp: '16 18 18 18  ' Temp: 97.7 F (36.5 C) 98.6 F (37 C) 98.3 F (36.8 C) 97.9 F (36.6 C)  TempSrc: Oral Oral Oral Oral  SpO2: 100% 96% 100% 95%  Weight:      Height:        Intake/Output Summary (Last 24 hours) at 03/15/2020 1111 Last data filed at 03/15/2020 0450 Gross per 24 hour  Intake 1417.4 ml  Output 850 ml  Net 567.4 ml   Filed Weights   03/13/20 1412  Weight: 61.2 kg    Examination:  General exam: Appears calm and comfortable  Respiratory system: Clear to auscultation. Respiratory effort normal. Cardiovascular system: S1 & S2 heard, RRR. No JVD, murmurs, rubs, gallops or clicks. No pedal edema. Gastrointestinal system: Abdomen is nondistended, soft and nontender. No organomegaly or masses felt. Normal bowel sounds heard. Central nervous system: Alert and oriented. No focal neurological deficits. Extremities: Symmetric 5 x 5 power.  Right hand with ACE wrap/dressing in place, clean/dry/intact Skin: No rashes, lesions or ulcers Psychiatry: Judgement and insight appear normal. Mood & affect appropriate.     Data Reviewed: I have personally  reviewed following labs and imaging studies  CBC: Recent Labs  Lab 03/13/20 1435 03/15/20 0401  WBC 10.1 4.7  NEUTROABS 7.3  --   HGB 14.6 14.0  HCT 41.9 41.6  MCV 95.2 97.7  PLT 242 157   Basic Metabolic Panel: Recent Labs  Lab 03/13/20 1435 03/15/20 0401  NA 137 140  K 3.7 5.1  CL 102 109  CO2 19* 22  GLUCOSE 95 102*  BUN 11 11  CREATININE 0.55* 0.61  CALCIUM 9.3 8.9   GFR: Estimated Creatinine Clearance: 116.9 mL/min (by C-G formula based on SCr of 0.61 mg/dL). Liver Function Tests: Recent Labs  Lab 03/13/20 1435 03/15/20 0401  AST 22 33  ALT 16 11  ALKPHOS 91 88  BILITOT 0.8 1.4*  PROT 7.8 5.9*  ALBUMIN 3.7 3.0*   No results for input(s): LIPASE, AMYLASE in the last 168 hours. No results for input(s): AMMONIA in the last 168 hours. Coagulation Profile: No results for input(s): INR, PROTIME in the  last 168 hours. Cardiac Enzymes: No results for input(s): CKTOTAL, CKMB, CKMBINDEX, TROPONINI in the last 168 hours. BNP (last 3 results) No results for input(s): PROBNP in the last 8760 hours. HbA1C: No results for input(s): HGBA1C in the last 72 hours. CBG: No results for input(s): GLUCAP in the last 168 hours. Lipid Profile: No results for input(s): CHOL, HDL, LDLCALC, TRIG, CHOLHDL, LDLDIRECT in the last 72 hours. Thyroid Function Tests: No results for input(s): TSH, T4TOTAL, FREET4, T3FREE, THYROIDAB in the last 72 hours. Anemia Panel: No results for input(s): VITAMINB12, FOLATE, FERRITIN, TIBC, IRON, RETICCTPCT in the last 72 hours. Sepsis Labs: Recent Labs  Lab 03/13/20 1435 03/13/20 1712 03/13/20 2124  LATICACIDVEN 2.3* 1.8 1.6    Recent Results (from the past 240 hour(s))  Blood culture (routine x 2)     Status: None (Preliminary result)   Collection Time: 03/13/20  2:35 PM   Specimen: BLOOD  Result Value Ref Range Status   Specimen Description BLOOD BLOOD LEFT FOREARM  Final   Special Requests   Final    BOTTLES DRAWN AEROBIC AND  ANAEROBIC Blood Culture results may not be optimal due to an inadequate volume of blood received in culture bottles   Culture   Final    NO GROWTH 2 DAYS Performed at [facility], [place] [address], [place], [place] [address]    Report Status PENDING  Incomplete  Blood culture (routine x 2)     Status: None (Preliminary result)   Collection Time: 03/13/20  5:12 PM   Specimen: BLOOD LEFT FOREARM  Result Value Ref Range Status   Specimen Description BLOOD LEFT FOREARM  Final   Special Requests   Final    BOTTLES DRAWN AEROBIC AND ANAEROBIC Blood Culture adequate volume   Culture   Final    NO GROWTH 2 DAYS Performed at [facility], [place] [address] [place], [place] [address]    Report Status PENDING  Incomplete  SARS Coronavirus 2 by RT PCR (hospital order, performed in [facility] hospital lab) Nasopharyngeal Nasopharyngeal Swab     Status: None   Collection Time: 03/13/20  6:39 PM   Specimen: Nasopharyngeal Swab  Result Value Ref Range Status   SARS Coronavirus 2 NEGATIVE NEGATIVE Final    Comment: (NOTE) SARS-CoV-2 target nucleic acids are NOT DETECTED. The SARS-CoV-2 RNA is generally detectable in upper and lower respiratory specimens during the acute phase of infection. The lowest concentration of SARS-CoV-2 viral copies this assay can detect is 250 copies / mL. A negative result does not preclude SARS-CoV-2 infection and should not be used as the sole basis for treatment or other patient management decisions.  A negative result may occur with improper specimen collection / handling, submission of specimen other than nasopharyngeal swab, presence of viral mutation(s) within the areas targeted by this assay, and inadequate number of viral copies (<250 copies / mL). A negative result must be combined with clinical observations, patient history, and epidemiological information. Fact Sheet for Patients:   StrictlyIdeas.no Fact Sheet for  Healthcare Providers: BankingDealers.co.za This test is not yet approved or cleared  by the [place] FDA and has been authorized for detection and/or diagnosis of SARS-CoV-2 by FDA under an Emergency Use Authorization (EUA).  This EUA will remain in effect (meaning this test can be used) for the duration of the COVID-19 declaration under Section 564(b)(1) of the Act, 21 U.S.C. section 360bbb-3(b)(1), unless the authorization is terminated or revoked sooner. Performed at [facility], [place] [address], [place], [place] [address]   Aerobic Culture (superficial specimen)     Status: None   Collection Time: 03/13/20  8:57 PM   Specimen: Wound  Result Value Ref Range Status   Specimen Description WOUND THUMB RIGHT  Final   Special Requests NONE  Final   Gram Stain   Final    NO WBC SEEN RARE GRAM POSITIVE COCCI Performed at [facility], [address] [place],  [address]    Culture MODERATE STAPHYLOCOCCUS AUREUS  Final   Report Status 03/15/2020 FINAL  Final   Organism ID, Bacteria STAPHYLOCOCCUS AUREUS  Final      Susceptibility   Staphylococcus aureus - MIC*    CIPROFLOXACIN >=8 RESISTANT Resistant     ERYTHROMYCIN >=8 RESISTANT Resistant     GENTAMICIN <=0.5 SENSITIVE Sensitive     OXACILLIN <=0.25 SENSITIVE Sensitive     TETRACYCLINE <=1 SENSITIVE Sensitive     VANCOMYCIN 1 SENSITIVE Sensitive     TRIMETH/SULFA <=10 SENSITIVE Sensitive     CLINDAMYCIN <=0.25 SENSITIVE Sensitive     RIFAMPIN <=0.5 SENSITIVE Sensitive     Inducible Clindamycin NEGATIVE Sensitive     * MODERATE STAPHYLOCOCCUS AUREUS      Radiology Studies: MR HAND RIGHT W WO CONTRAST  Result Date: 03/14/2020 CLINICAL DATA:  Cellulitis and swelling of the right hand secondary to a laceration of the tip of the right thumb. Abnormal radiographs dated 03/13/2020 with possible erosion of the tuft of the distal phalanx of the thumb. EXAM: MRI OF THE RIGHT HAND WITHOUT  AND WITH CONTRAST TECHNIQUE: Multiplanar, multisequence MR imaging of the right hand was performed before and after the administration of intravenous contrast. CONTRAST:  34m GADAVIST GADOBUTROL 1 MMOL/ML IV SOLN COMPARISON:  Radiographs dated 03/13/2020 FINDINGS: Bones/Joint/Cartilage There is osteomyelitis involving the entire distal phalanx of right thumb. The other bones of the right hand appear normal. No joint effusions. Ligaments Normal. Muscles and Tendons Normal. Soft tissues There is diffuse edema of the soft tissues of the hand including muscles of the thenar eminence. There is a focal area of what appears to be devitalized soft tissue overlying the dorsal aspect of the IP joint of the thumb best seen on image 38 of series 15 and image 21 of series 14. There is loss of soft tissue at the dorsal aspect of the tip of the thumb. No definable soft tissue abscesses. IMPRESSION: 1. Osteomyelitis involving the entire distal phalanx of the right thumb. 2. Focal area of devitalized soft tissue overlying the dorsal aspect of the IP joint of the thumb. 3. Diffuse edema of the soft tissues of the hand including muscles of thenar eminence consistent with cellulitis and myositis. No discrete soft tissue abscesses. Electronically Signed   By: JLorriane ShireM.D.   On: 03/14/2020 13:33   DG Hand Complete Right  Result Date: 03/13/2020 CLINICAL DATA:  Cellulitis of the right thumb secondary to and laceration 1.5 weeks ago. Redness and swelling. Draining pus. EXAM: RIGHT HAND - COMPLETE 3+ VIEW COMPARISON:  None. FINDINGS: There is diffuse soft tissue swelling of the hand and of the entire right thumb. Soft tissue irregularity of the tip of the thumb. On a single view there is loss of the cortex of the radial aspect of the tuft of the distal phalanx of the thumb which could represent osteomyelitis. IMPRESSION: 1. Diffuse soft tissue swelling of the hand and of the entire right thumb. 2. Soft tissue irregularity  at the  tip of the thumb. 3. Possible osteomyelitis of the tuft of the distal phalanx of the thumb. Electronically Signed   By: Lorriane Shire M.D.   On: 03/13/2020 15:17     Scheduled Meds: . bacitracin   Topical BID  . buprenorphine-naloxone  1 tablet Sublingual Daily  . multivitamin with minerals  1 tablet Oral Daily  . naproxen  500 mg Oral BID WC  . nicotine  14 mg Transdermal Daily   Continuous Infusions: . sodium chloride 75 mL/hr at 03/13/20 2159  .  ceFAZolin (ANCEF) IV    . vancomycin 1,000 mg (03/15/20 1110)     LOS: 2 days    Time spent: 36 minutes spent on chart review, discussion with nursing staff, consultants, updating family and interview/physical exam; more than 50% of that time was spent in counseling and/or coordination of care.    Artrell Lawless J [place] DO [facility] Available via Epic secure chat 7am-7pm After these hours, please refer to coverage provider listed on [email or web address] 03/15/2020, 11:11 AM

## 2020-03-16 LAB — COMPREHENSIVE METABOLIC PANEL
ALT: 16 U/L (ref 0–44)
AST: 25 U/L (ref 15–41)
Albumin: 3 g/dL — ABNORMAL LOW (ref 3.5–5.0)
Alkaline Phosphatase: 79 U/L (ref 38–126)
Anion gap: 13 (ref 5–15)
BUN: 14 mg/dL (ref 6–20)
CO2: 21 mmol/L — ABNORMAL LOW (ref 22–32)
Calcium: 8.9 mg/dL (ref 8.9–10.3)
Chloride: 106 mmol/L (ref 98–111)
Creatinine, Ser: 0.53 mg/dL — ABNORMAL LOW (ref 0.61–1.24)
GFR calc Af Amer: >60 mL/min (ref >60)
GFR calc non Af Amer: >60 mL/min (ref >60)
Glucose, Bld: 141 mg/dL — ABNORMAL HIGH (ref 70–99)
Potassium: 4.2 mmol/L (ref 3.5–5.1)
Sodium: 140 mmol/L (ref 135–145)
Total Bilirubin: 0.9 mg/dL (ref 0.3–1.2)
Total Protein: 6.2 g/dL — ABNORMAL LOW (ref 6.5–8.1)

## 2020-03-16 LAB — HCV RNA QUANT RFLX ULTRA OR GENOTYP
HCV RNA Qnt(log copy/mL): UNDETERMINED log10 IU/mL
HepC Qn: NOT DETECTED IU/mL

## 2020-03-16 LAB — CBC
HCT: 42.3 % (ref 39.0–52.0)
Hemoglobin: 14.4 g/dL (ref 13.0–17.0)
MCH: 33 pg (ref 26.0–34.0)
MCHC: 34 g/dL (ref 30.0–36.0)
MCV: 96.8 fL (ref 80.0–100.0)
Platelets: 236 10*3/uL (ref 150–400)
RBC: 4.37 MIL/uL (ref 4.22–5.81)
RDW: 11.8 % (ref 11.5–15.5)
WBC: 5.8 10*3/uL (ref 4.0–10.5)
nRBC: 0 % (ref 0.0–0.2)

## 2020-03-16 MED ORDER — IBUPROFEN 200 MG PO TABS
600.0000 mg | ORAL_TABLET | Freq: Four times a day (QID) | ORAL | Status: DC
Start: 2020-03-16 — End: 2023-05-15

## 2020-03-16 MED ORDER — ACETAMINOPHEN 325 MG PO TABS
650.0000 mg | ORAL_TABLET | Freq: Four times a day (QID) | ORAL | Status: DC
Start: 2020-03-16 — End: 2023-05-15

## 2020-03-16 MED ORDER — CHLORHEXIDINE GLUCONATE 4 % EX LIQD
CUTANEOUS | Status: AC
  Filled 2020-03-16: qty 15

## 2020-03-16 MED ORDER — CHLORHEXIDINE GLUCONATE 4 % EX LIQD: Freq: Two times a day (BID) | CUTANEOUS | Status: DC

## 2020-03-16 NOTE — Discharge Instructions (Signed)
Do your wound care 2-3 times daily as taught in the hospital, aiming to get the wound clean and keep the open, raw areas moist (but regular intact skin dry!)  Take your antibiotics as directed.  Direct any antibiotic or infection-related questions to Dr. Clinton Gallant office Direct any wound care or movement-related questions to Dr. Carollee Massed office

## 2020-03-16 NOTE — Plan of Care (Signed)
  Problem: Education: Goal: Knowledge of General Education information will improve Description: Including pain rating scale, medication(s)/side effects and non-pharmacologic comfort measures Outcome: Progressing   Problem: Health Behavior/Discharge Planning: Goal: Ability to manage health-related needs will improve Outcome: Progressing   Problem: Pain Managment: Goal: General experience of comfort will improve Outcome: Progressing   

## 2020-03-16 NOTE — Progress Notes (Signed)
PROGRESS NOTE    Tanner Hoffman  EHM:[id number] DOB: 1991/06/11 DOA: 03/13/2020 PCP: Patient, No Pcp Per    Brief Narrative:  Tanner Hoffman is a 29 y.o. male with a past medical history of polysubstance abuse, hepatitis C who was in his usual state of health till about a week and a half ago when he sustained cut to the right thumb near the tip of the thumb.  He has had some pain in that area with some redness.  He was picking that site several days.  The swelling and the redness got worse.  He started noticing redness involving the entire hand and saw streaks running up his forearm.  He went to the ED in hospital in [place].  However he decided to leave [place].  Today he decided to come here for further treatment.  Pain is currently 6 out of 10 in intensity but mainly when he is moving his hand and his thumb.  Denies any fever or chills at home.  No nausea or vomiting.  Denies lesions in any other part of his body.  He mentions that he has not done any IV drug use in the last 6 months.  He says that he gets Suboxone from an unknown source and takes 1 tablet daily, 2 mg each.  He says that he has not been able to establish at a Suboxone clinic due to lack of insurance.  In the emergency department patient was noted to have significant swelling involving his right hand especially his right thumb.  Erythematous streaks were noted going up the forearm.  He has WBC was normal.  His lactic acid was mildly elevated.  Discussions were held with Dr. Grandville Silos with hand surgery.  Patient underwent x-ray which raised concern for osteomyelitis involving the distal phalanx.  Patient will need hospitalization for further management.   Assessment & Plan:   Principal Problem:   Cellulitis of right hand Active Problems:   Chronic hepatitis C without hepatic coma (HCC)   Open wound of right thumb   Abscess of right thumb   Right hand cellulitis/myositis Right thumb osteomyelitis Patient  presenting with progressive pain/redness to right hand/thumb following injury roughly 2 weeks prior.  WBC count 10.1 with a lactic acid of 2.3.  ESR 25, CRP 3.9.  Underwent bedside surgical drainage on 03/13/2020 by orthopedics hand surgery, Dr. Grandville Silos.  MR right hand with osteomyelitis entire distal phalanx right thumb with diffuse edema/soft tissue edema hand and muscles consistent with cellulitis/myositis.  Wound culture with MSSA. --Blood cultures x2: No growth x 3 days --Continue empiric antibiotics with vancomycin/cefazolin, plan to discharge on Augmentin 875/170m BID x 8 weeks per ID --Continue dressing changes twice daily per orthopedics --F/U with ID arranged w/ Dr. VDrucilla Schmidton 04/01/2020 at [facility] --F/U orthopedics, Dr. TGrandville Silosin 2 weeks --Supportive care, pain control  History of polysubstance abuse/IVDU Patient denies any recent IV drug use, last reported use 6 months ago.  Currently gets Suboxone from a unknown source in which he takes 2 mg p.o. daily. --Suboxone 2 mg p.o. daily --TOC consult to assist establish care with Suboxone clinic  Tobacco use disorder: Nicotine patch  EtOH use: Reports drinks 2 beers a day. --Monitor for withdrawal symptoms  Hx chronic hepatitis C Was previously followed at the ID clinic.  Was on 12 weeks of Epclusa; but apparently did not finish treatment course few months ago as he reports misplaces medications. --HCV RNA quantitative: Pending --Has outpatient follow-up scheduled  with infectious disease as above    DVT prophylaxis: Lovenox Code Status: Full code Family Communication: Discussed with patient extensively at bedside  Disposition Plan:  Status is: Inpatient  Remains inpatient appropriate because:Ongoing active pain requiring inpatient pain management, Ongoing diagnostic testing needed not appropriate for outpatient work up and IV treatments appropriate due to intensity of illness or inability to take PO plan to discharge home  tomorrow with outpatient follow-up with ID/orthopedics.  Will need transition of care pharmacy for prolonged Augmentin and supplies for dressing changes   Dispo: The patient is from: Home              Anticipated d/c is to: Home              Anticipated d/c date is: 1 day              Patient currently is not medically stable to d/c.   Consultants:   Orthopedic hand surgery, Dr. Grandville Silos  Infectious disease - Dr. Tommy Medal  Procedures:   Bedside incision and drainage, Dr. Grandville Silos 03/13/2020  Antimicrobials:   Vancomycin 5/28>>  Cefazolin 5/30>>  Ceftriaxone 5/28 - 5/29  Subjective: Patient seen and examined bedside, resting comfortably.  States pain is controlled.  Seen by orthopedics again today with further clarification/recommendations for dressing changes.  Ortho would like patient to stay to receive IV antibiotics through today and discharge tomorrow per ID recommendations of Augmentin x8 weeks.  Discussed with social work this morning regarding discharge plans for tomorrow in need of transition of care pharmacy due to lack of insurance.  No other specific complaints or concerns at this time.  Denies headache, no fever/chills/night sweats, no nausea/vomiting/diarrhea, no chest pain, no palpitations, no shortness of breath, no abdominal pain.  No acute events overnight per nursing staff.  Objective: Vitals:   03/15/20 1344 03/15/20 2018 03/16/20 0415 03/16/20 0731  BP: 110/76 116/67 112/67 (!) 133/98  Pulse: 100 98 88 73  Resp: _0 Temp: 98.6 F (37 C) 98.5 F (36.9 C) 98.3 F (36.8 C) (!) 97.3 F (36.3 C)  TempSrc: Oral Oral Oral Oral  SpO2: 96% 100% 100% 99%  Weight:      Height:        Intake/Output Summary (Last 24 hours) at 03/16/2020 1052 Last data filed at 03/16/2020 0415 Gross per 24 hour  Intake 2158.39 ml  Output 2500 ml  Net -341.61 ml   Filed Weights   03/13/20 1412  Weight: 61.2 kg    Examination:  General exam: Appears calm and  comfortable  Respiratory system: Clear to auscultation. Respiratory effort normal. Cardiovascular system: S1 & S2 heard, RRR. No JVD, murmurs, rubs, gallops or clicks. No pedal edema. Gastrointestinal system: Abdomen is nondistended, soft and nontender. No organomegaly or masses felt. Normal bowel sounds heard. Central nervous system: Alert and oriented. No focal neurological deficits. Extremities: Symmetric 5 x 5 power.  Right hand with ACE wrap/dressing in place, clean/dry/intact Skin: No rashes, lesions or ulcers Psychiatry: Judgement and insight appear normal. Mood & affect appropriate.     Data Reviewed: I have personally reviewed following labs and imaging studies  CBC: Recent Labs  Lab 03/13/20 1435 03/15/20 0401 03/16/20 0837  WBC 10.1 4.7 5.8  NEUTROABS 7.3  --   --   HGB 14.6 14.0 14.4  HCT 41.9 41.6 42.3  MCV 95.2 97.7 96.8  PLT 242 210 315   Basic Metabolic Panel: Recent Labs  Lab 03/13/20 1435 03/15/20 0401  03/16/20 0837  NA 137 140 140  K 3.7 5.1 4.2  CL 102 109 106  CO2 19* 22 21*  GLUCOSE 95 102* 141*  BUN _0 CREATININE 0.55* 0.61 0.53*  CALCIUM 9.3 8.9 8.9   GFR: Estimated Creatinine Clearance: 116.9 mL/min (A) (by C-G formula based on SCr of 0.53 mg/dL (L)). Liver Function Tests: Recent Labs  Lab 03/13/20 1435 03/15/20 0401 03/16/20 0837  AST 22 33 25  ALT _1 ALKPHOS 91 88 79  BILITOT 0.8 1.4* 0.9  PROT 7.8 5.9* 6.2*  ALBUMIN 3.7 3.0* 3.0*   No results for input(s): LIPASE, AMYLASE in the last 168 hours. No results for input(s): AMMONIA in the last 168 hours. Coagulation Profile: No results for input(s): INR, PROTIME in the last 168 hours. Cardiac Enzymes: No results for input(s): CKTOTAL, CKMB, CKMBINDEX, TROPONINI in the last 168 hours. BNP (last 3 results) No results for input(s): PROBNP in the last 8760 hours. HbA1C: No results for input(s): HGBA1C in the last 72 hours. CBG: No results for input(s): GLUCAP in the  last 168 hours. Lipid Profile: No results for input(s): CHOL, HDL, LDLCALC, TRIG, CHOLHDL, LDLDIRECT in the last 72 hours. Thyroid Function Tests: No results for input(s): TSH, T4TOTAL, FREET4, T3FREE, THYROIDAB in the last 72 hours. Anemia Panel: No results for input(s): VITAMINB12, FOLATE, FERRITIN, TIBC, IRON, RETICCTPCT in the last 72 hours. Sepsis Labs: Recent Labs  Lab 03/13/20 1435 03/13/20 1712 03/13/20 2124  LATICACIDVEN 2.3* 1.8 1.6    Recent Results (from the past 240 hour(s))  Blood culture (routine x 2)     Status: None (Preliminary result)   Collection Time: 03/13/20  2:35 PM   Specimen: BLOOD  Result Value Ref Range Status   Specimen Description BLOOD BLOOD LEFT FOREARM  Final   Special Requests   Final    BOTTLES DRAWN AEROBIC AND ANAEROBIC Blood Culture results may not be optimal due to an inadequate volume of blood received in culture bottles   Culture   Final    NO GROWTH 3 DAYS Performed at [facility], [place] [address], [place], [place] [address]    Report Status PENDING  Incomplete  Blood culture (routine x 2)     Status: None (Preliminary result)   Collection Time: 03/13/20  5:12 PM   Specimen: BLOOD LEFT FOREARM  Result Value Ref Range Status   Specimen Description BLOOD LEFT FOREARM  Final   Special Requests   Final    BOTTLES DRAWN AEROBIC AND ANAEROBIC Blood Culture adequate volume   Culture   Final    NO GROWTH 3 DAYS Performed at [facility], [place] [address] [place], [place] [address]    Report Status PENDING  Incomplete  SARS Coronavirus 2 by RT PCR (hospital order, performed in [facility] hospital lab) Nasopharyngeal Nasopharyngeal Swab     Status: None   Collection Time: 03/13/20  6:39 PM   Specimen: Nasopharyngeal Swab  Result Value Ref Range Status   SARS Coronavirus 2 NEGATIVE NEGATIVE Final    Comment: (NOTE) SARS-CoV-2 target nucleic acids are NOT DETECTED. The SARS-CoV-2 RNA is generally detectable in upper and  lower respiratory specimens during the acute phase of infection. The lowest concentration of SARS-CoV-2 viral copies this assay can detect is 250 copies / mL. A negative result does not preclude SARS-CoV-2 infection and should not be used as the sole basis for treatment or other patient management decisions.  A negative  result may occur with improper specimen collection / handling, submission of specimen other than nasopharyngeal swab, presence of viral mutation(s) within the areas targeted by this assay, and inadequate number of viral copies (<250 copies / mL). A negative result must be combined with clinical observations, patient history, and epidemiological information. Fact Sheet for Patients:   StrictlyIdeas.no Fact Sheet for Healthcare Providers: BankingDealers.co.za This test is not yet approved or cleared  by the [place] FDA and has been authorized for detection and/or diagnosis of SARS-CoV-2 by FDA under an Emergency Use Authorization (EUA).  This EUA will remain in effect (meaning this test can be used) for the duration of the COVID-19 declaration under Section 564(b)(1) of the Act, 21 U.S.C. section 360bbb-3(b)(1), unless the authorization is terminated or revoked sooner. Performed at [facility], [place] [address], [place], [place] [address]   Aerobic Culture (superficial specimen)     Status: None   Collection Time: 03/13/20  8:57 PM   Specimen: Wound  Result Value Ref Range Status   Specimen Description WOUND THUMB RIGHT  Final   Special Requests NONE  Final   Gram Stain   Final    NO WBC SEEN RARE GRAM POSITIVE COCCI Performed at [facility], [address], [place], [place] [address]    Culture MODERATE STAPHYLOCOCCUS AUREUS  Final   Report Status 03/15/2020 FINAL  Final   Organism ID, Bacteria STAPHYLOCOCCUS AUREUS  Final      Susceptibility   Staphylococcus aureus - MIC*    CIPROFLOXACIN >=8  RESISTANT Resistant     ERYTHROMYCIN >=8 RESISTANT Resistant     GENTAMICIN <=0.5 SENSITIVE Sensitive     OXACILLIN <=0.25 SENSITIVE Sensitive     TETRACYCLINE <=1 SENSITIVE Sensitive     VANCOMYCIN 1 SENSITIVE Sensitive     TRIMETH/SULFA <=10 SENSITIVE Sensitive     CLINDAMYCIN <=0.25 SENSITIVE Sensitive     RIFAMPIN <=0.5 SENSITIVE Sensitive     Inducible Clindamycin NEGATIVE Sensitive     * MODERATE STAPHYLOCOCCUS AUREUS      Radiology Studies: MR HAND RIGHT W WO CONTRAST  Result Date: 03/14/2020 CLINICAL DATA:  Cellulitis and swelling of the right hand secondary to a laceration of the tip of the right thumb. Abnormal radiographs dated 03/13/2020 with possible erosion of the tuft of the distal phalanx of the thumb. EXAM: MRI OF THE RIGHT HAND WITHOUT AND WITH CONTRAST TECHNIQUE: Multiplanar, multisequence MR imaging of the right hand was performed before and after the administration of intravenous contrast. CONTRAST:  4m GADAVIST GADOBUTROL 1 MMOL/ML IV SOLN COMPARISON:  Radiographs dated 03/13/2020 FINDINGS: Bones/Joint/Cartilage There is osteomyelitis involving the entire distal phalanx of right thumb. The other bones of the right hand appear normal. No joint effusions. Ligaments Normal. Muscles and Tendons Normal. Soft tissues There is diffuse edema of the soft tissues of the hand including muscles of the thenar eminence. There is a focal area of what appears to be devitalized soft tissue overlying the dorsal aspect of the IP joint of the thumb best seen on image 38 of series 15 and image 21 of series 14. There is loss of soft tissue at the dorsal aspect of the tip of the thumb. No definable soft tissue abscesses. IMPRESSION: 1. Osteomyelitis involving the entire distal phalanx of the right thumb. 2. Focal area of devitalized soft tissue overlying the dorsal aspect of the IP joint of the thumb. 3. Diffuse edema of the soft tissues of the hand including muscles of thenar  eminence consistent  with cellulitis and myositis. No discrete soft tissue abscesses. Electronically Signed   By: Lorriane Shire M.D.   On: 03/14/2020 13:33     Scheduled Meds: . chlorhexidine      . bacitracin   Topical BID  . buprenorphine-naloxone  1 tablet Sublingual Daily  . multivitamin with minerals  1 tablet Oral Daily  . naproxen  500 mg Oral BID WC  . nicotine  14 mg Transdermal Daily   Continuous Infusions: . sodium chloride 75 mL/hr at 03/13/20 2159  .  ceFAZolin (ANCEF) IV 2 g (03/16/20 0502)     LOS: 3 days    Time spent: 34 minutes spent on chart review, discussion with nursing staff, consultants, updating family and interview/physical exam; more than 50% of that time was spent in counseling and/or coordination of care.    Jeni Duling J [place] DO [facility] Available via Epic secure chat 7am-7pm After these hours, please refer to coverage provider listed on [email or web address] 03/16/2020, 10:52 AM

## 2020-03-16 NOTE — Progress Notes (Addendum)
5-28 ED Bedside incision/drainage & nailplate removal Cx = [facility]SS[facility] Wound care underway MRI c/w entire distal phalanx osteomyelitis by report ID recs made and f/u arranged  Wound today improving.  Mixed wet and dry purulent discharge on thumb.  I excisionally debrided sloughing epidermal tissue.  I further clarified with nursing and the patient how to do the cleansing, and I went to OR to obtain some surgical scrub soap to use (contains CHG).  From my POV, he can be d/c from hospital later tonight or in AM, after today's IV antibiotics and both sets of wound care (being done BID), so long as there is a plan in place for continued successful after-hospital wound care (supplies, housing, anti-biotics, etc) and antibiotic therapy.  He has f/u with ID on 6-16.  From surgical perspective, I will plan to look again at the wound in approx 2 weeks.  Will sign-off, but remain available as needed for surgical questions.    Neil Crouch, MD [facility] [phone]

## 2020-03-17 DIAGNOSIS — M869 Osteomyelitis, unspecified: Secondary | ICD-10-CM | POA: Diagnosis present

## 2020-03-17 DIAGNOSIS — S61001S Unspecified open wound of right thumb without damage to nail, sequela: Secondary | ICD-10-CM

## 2020-03-17 DIAGNOSIS — F1910 Other psychoactive substance abuse, uncomplicated: Secondary | ICD-10-CM

## 2020-03-17 DIAGNOSIS — L02511 Cutaneous abscess of right hand: Secondary | ICD-10-CM

## 2020-03-17 DIAGNOSIS — M86241 Subacute osteomyelitis, right hand: Secondary | ICD-10-CM

## 2020-03-17 MED ORDER — AMOXICILLIN-POT CLAVULANATE 875-125 MG PO TABS: 1.0000 | ORAL_TABLET | Freq: Two times a day (BID) | ORAL | 0 refills | Status: AC

## 2020-03-17 MED FILL — AMOX-CLAV 875-125 MG TABLET: 875-125 | 34 days supply | Qty: 68 | Fill #0

## 2020-03-17 NOTE — Plan of Care (Signed)

## 2020-03-17 NOTE — Progress Notes (Signed)
Patient discharged home with friend by private car.  All personal belongings gathered and taken with him at time of discharge.  Instructions went over including antibiotics and wound care, denied any questions.

## 2020-03-17 NOTE — Discharge Summary (Signed)
Physician Discharge Summary  Tanner Hoffman VOZ:[id number] DOB: 04/03/1991 DOA: 03/13/2020  PCP: Patient, No Pcp Per  Admit date: 03/13/2020 Discharge date: 03/17/2020  Admitted From: Home Disposition: Home  Recommendations for Outpatient Follow-up:  1. Follow up with PCP in 1-2 weeks 2. With infectious disease, Dr. Tommy Medal as scheduled on 04/01/2020 at 10 AM 3. Follow-up with orthopedics, Dr. Grandville Silos in 2 weeks 4. Call to make an appointment with the Suboxone clinic to continue treatment  Home Health: No Equipment/Devices: None  Discharge Condition: Stable CODE STATUS: Full code Diet recommendation: Regular diet  History of present illness:  Tanner Hoffman is a 29 y.o.malewith a past medical history of polysubstance abuse, hepatitis C who was in his usual state of health till about a week and a half ago when he sustained cut to the right thumb near the tip of the thumb. He has had some pain in that area with some redness. He was picking that site several days. The swelling and the redness got worse. He started noticing redness involving the entire hand and saw streaks running up his forearm. [place] to [place]. Today he decided to come here for further treatment. Pain is currently 6 out of 10 in intensity but mainly when he is moving his hand and his thumb. Denies any fever or chills at home. No nausea or vomiting. Denies lesions in any other part of his body. He mentions that he has not done any IV drug use in the last 6 months. He says that he gets Suboxone from an unknown source and takes 1 tablet daily, 2 mg each. He says that he has not been able to establish at [facility] clinic due to lack of insurance.  In the emergency department patient was noted to have significant swelling involving his right hand especially his right thumb. Erythematous streaks were noted going up the forearm. He has  WBC was normal. His lactic acid was mildly elevated. Discussions were held with Dr. Grandville Silos with hand surgery. Patient underwent x-ray which raised concern for osteomyelitis involving the distal phalanx. Patient will need hospitalization for further management.  Hospital course:  Right hand MSSA cellulitis/myositis Right thumb osteomyelitis Patient presenting with progressive pain/redness to right hand/thumb following injury roughly 2 weeks prior.  WBC count 10.1 with a lactic acid of 2.3.  ESR 25, CRP 3.9.  Underwent bedside surgical drainage on 03/13/2020 by orthopedics hand surgery, Dr. Grandville Silos.  MR right hand with osteomyelitis entire distal phalanx right thumb with diffuse edema/soft tissue edema hand and muscles consistent with cellulitis/myositis.  Wound culture with MSSA.  Blood cultures showed no growth during hospitalization.  Patient was started on empiric antibiotics inpatient with vancomycin and cefazolin and will continue antibiotics following discharge with Augmentin 875/18m BID x 8 weeks per ID. Continue dressing changes twice daily per orthopedics.  Follow-up scheduled with infectious disease Dr. VDrucilla Schmidton [facility], Dr. TGrandville Silosin 2 weeks.   History of polysubstance abuse/IVDU Patient denies any recent IV drug use, last reported use 6 months ago.  Currently gets Suboxone from a unknown source in which he takes 2 mg p.o. daily.  Patient needs to establish care with the Suboxone clinic to continue treatment.  Tobacco use disorder: Nicotine patch  EtOH use: Reports drinks 2 beers a day.  Counseled on need for cessation.  Hx chronic hepatitis C Was previously followed at the ID clinic.  Was on 12  weeks of Epclusa; but apparently did not finish treatment course few months ago as he reports misplaces medications.  Outpatient follow-up with infectious disease as above.  Discharge Diagnoses:  Principal Problem:   Osteomyelitis ([place]) Active  Problems:   Polysubstance abuse ([place])   Chronic hepatitis C without hepatic coma (HCC)   Cellulitis of right hand   Open wound of right thumb   Abscess of right thumb    Discharge Instructions  Discharge Instructions    Call MD for:  extreme fatigue   Complete by: As directed    Call MD for:  persistant dizziness or light-headedness   Complete by: As directed    Call MD for:  persistant nausea and vomiting   Complete by: As directed    Call MD for:  redness, tenderness, or signs of infection (pain, swelling, redness, odor or green/yellow discharge around incision site)   Complete by: As directed    Call MD for:  severe uncontrolled pain   Complete by: As directed    Call MD for:  temperature >100.4   Complete by: As directed    Diet - low sodium heart healthy   Complete by: As directed    Increase activity slowly   Complete by: As directed      Allergies as of 03/17/2020   No Known Allergies     Medication List    STOP taking these medications   naproxen 500 MG tablet Commonly known as: NAPROSYN   Sofosbuvir-Velpatasvir 400-100 MG Tabs Commonly known as: Epclusa     TAKE these medications   acetaminophen 325 MG tablet Commonly known as: Tylenol Take 2 tablets (650 mg total) by mouth every 6 (six) hours. What changed: Another medication with the same name was removed. Continue taking this medication, and follow the directions you see here.   amoxicillin-clavulanate 875-125 MG tablet Commonly known as: Augmentin Take 1 tablet by mouth 2 (two) times daily. Continue through 05/08/2020   buprenorphine-naloxone 2-0.5 mg Subl SL tablet Commonly known as: SUBOXONE Place 1 tablet under the tongue daily as needed (For overdose).   ibuprofen 200 MG tablet Commonly known as: Advil Take 3 tablets (600 mg total) by mouth every 6 (six) hours.      Follow-up Information    Tommy Medal, Lavell Islam, MD Follow up.   Specialty: Infectious Diseases Why: remember your video  appt at 10PM on 04-01-20. Contact information: [address] [place] [address] [phone]        Milly Jakob, MD. Call.   Specialty: Orthopedic Surgery Why: on Tuesday to make a follow-up appointment for the week of 03-30-20 Contact information: [place] [address] [phone]        [facility] Follow up.   Why: [facility] offers services for patient on a sliding scale basis for payment.  Please call to followup for [place], [place], [place] [address]         No Known Allergies  Consultations:  Orthopedic hand surgery, Dr. Grandville Silos  Infectious disease - Dr. Tommy Medal   Procedures/Studies: MR HAND RIGHT W WO CONTRAST  Result Date: 03/14/2020 CLINICAL DATA:  Cellulitis and swelling of the right hand secondary to a laceration of the tip of the right thumb. Abnormal radiographs dated 03/13/2020 with possible erosion of the tuft of the distal phalanx of the thumb. EXAM: MRI OF THE RIGHT HAND WITHOUT AND WITH CONTRAST TECHNIQUE: Multiplanar, multisequence MR  imaging of the right hand was performed before and after the administration of intravenous contrast. CONTRAST:  41m GADAVIST GADOBUTROL 1 MMOL/ML IV SOLN COMPARISON:  Radiographs dated 03/13/2020 FINDINGS: Bones/Joint/Cartilage There is osteomyelitis involving the entire distal phalanx of right thumb. The other bones of the right hand appear normal. No joint effusions. Ligaments Normal. Muscles and Tendons Normal. Soft tissues There is diffuse edema of the soft tissues of the hand including muscles of the thenar eminence. There is a focal area of what appears to be devitalized soft tissue overlying the dorsal aspect of the IP joint of the thumb best seen on image 38 of series 15 and image 21 of series 14. There is loss of soft tissue at the dorsal aspect of the tip of the thumb. No definable soft tissue abscesses.  IMPRESSION: 1. Osteomyelitis involving the entire distal phalanx of the right thumb. 2. Focal area of devitalized soft tissue overlying the dorsal aspect of the IP joint of the thumb. 3. Diffuse edema of the soft tissues of the hand including muscles of thenar eminence consistent with cellulitis and myositis. No discrete soft tissue abscesses. Electronically Signed   By: JLorriane ShireM.D.   On: 03/14/2020 13:33   DG Hand Complete Right  Result Date: 03/13/2020 CLINICAL DATA:  Cellulitis of the right thumb secondary to and laceration 1.5 weeks ago. Redness and swelling. Draining pus. EXAM: RIGHT HAND - COMPLETE 3+ VIEW COMPARISON:  None. FINDINGS: There is diffuse soft tissue swelling of the hand and of the entire right thumb. Soft tissue irregularity of the tip of the thumb. On a single view there is loss of the cortex of the radial aspect of the tuft of the distal phalanx of the thumb which could represent osteomyelitis. IMPRESSION: 1. Diffuse soft tissue swelling of the hand and of the entire right thumb. 2. Soft tissue irregularity at the tip of the thumb. 3. Possible osteomyelitis of the tuft of the distal phalanx of the thumb. Electronically Signed   By: JLorriane ShireM.D.   On: 03/13/2020 15:17      Subjective: Patient seen and examined bedside, resting comfortably.  Pain controlled.  Ready for discharge home today.  Discussed with patient once again needs to sensate from all substance abuse and to follow-up closely with specialists including infectious disease and orthopedics.  Patient states is able to care for his wound on his own and understands how to complete dressing changes.  No other complaints or concerns at this time.  Denies headache, no fever/chills/night sweats, no nausea/vomiting/diarrhea, no chest pain, no palpitations, no shortness of breath, no abdominal pain.  Discharge Exam: Vitals:   03/17/20 0339 03/17/20 0740  BP: (!) 141/92 117/90  Pulse: 63 74  Resp: 17 16  Temp:  (!) 97.5 F (36.4 C) 97.9 F (36.6 C)  SpO2: 100% 99%   Vitals:   03/16/20 1324 03/16/20 1922 03/17/20 0339 03/17/20 0740  BP: (!) 117/93 (!) 111/59 (!) 141/92 117/90  Pulse: 95 79 63 74  Resp: _0 Temp: 98.4 F (36.9 C) 98 F (36.7 C) (!) 97.5 F (36.4 C) 97.9 F (36.6 C)  TempSrc: Oral Oral Oral Oral  SpO2: 96% 100% 100% 99%  Weight:      Height:        General: Pt is alert, awake, not in acute distress Cardiovascular: RRR, S1/S2 +, no rubs, no gallops Respiratory: CTA bilaterally, no wheezing, no rhonchi Abdominal: Soft, NT, ND, bowel sounds + Extremities: no  edema, no cyanosis, right thumb with a dressing in place/Ace wrap, clean/dry/intact    The results of significant diagnostics from this hospitalization (including imaging, microbiology, ancillary and laboratory) are listed below for reference.     Microbiology: Recent Results (from the past 240 hour(s))  Blood culture (routine x 2)     Status: None (Preliminary result)   Collection Time: 03/13/20  2:35 PM   Specimen: BLOOD  Result Value Ref Range Status   Specimen Description BLOOD BLOOD LEFT FOREARM  Final   Special Requests   Final    BOTTLES DRAWN AEROBIC AND ANAEROBIC Blood Culture results may not be optimal due to an inadequate volume of blood received in culture bottles   Culture   Final    NO GROWTH 3 DAYS Performed at [facility], [place] [address], [place], [place] [address]    Report Status PENDING  Incomplete  Blood culture (routine x 2)     Status: None (Preliminary result)   Collection Time: 03/13/20  5:12 PM   Specimen: BLOOD LEFT FOREARM  Result Value Ref Range Status   Specimen Description BLOOD LEFT FOREARM  Final   Special Requests   Final    BOTTLES DRAWN AEROBIC AND ANAEROBIC Blood Culture adequate volume   Culture   Final    NO GROWTH 3 DAYS Performed at [facility], [place] [address] [place], [place] [address]    Report Status PENDING  Incomplete  SARS  Coronavirus 2 by RT PCR (hospital order, performed in [facility] hospital lab) Nasopharyngeal Nasopharyngeal Swab     Status: None   Collection Time: 03/13/20  6:39 PM   Specimen: Nasopharyngeal Swab  Result Value Ref Range Status   SARS Coronavirus 2 NEGATIVE NEGATIVE Final    Comment: (NOTE) SARS-CoV-2 target nucleic acids are NOT DETECTED. The SARS-CoV-2 RNA is generally detectable in upper and lower respiratory specimens during the acute phase of infection. The lowest concentration of SARS-CoV-2 viral copies this assay can detect is 250 copies / mL. A negative result does not preclude SARS-CoV-2 infection and should not be used as the sole basis for treatment or other patient management decisions.  A negative result may occur with improper specimen collection / handling, submission of specimen other than nasopharyngeal swab, presence of viral mutation(s) within the areas targeted by this assay, and inadequate number of viral copies (<250 copies / mL). A negative result must be combined with clinical observations, patient history, and epidemiological information. Fact Sheet for Patients:   StrictlyIdeas.no Fact Sheet for Healthcare Providers: BankingDealers.co.za This test is not yet approved or cleared  by the [place] FDA and has been authorized for detection and/or diagnosis of SARS-CoV-2 by FDA under an Emergency Use Authorization (EUA).  This EUA will remain in effect (meaning this test can be used) for the duration of the COVID-19 declaration under Section 564(b)(1) of the Act, 21 U.S.C. section 360bbb-3(b)(1), unless the authorization is terminated or revoked sooner. Performed at [facility], [place] [address] [place], [place] [address]   Aerobic Culture (superficial specimen)     Status: None   Collection Time: 03/13/20  8:57 PM   Specimen: Wound  Result Value Ref Range Status   Specimen Description WOUND THUMB  RIGHT  Final   Special Requests NONE  Final   Gram Stain   Final    NO WBC SEEN RARE GRAM POSITIVE COCCI Performed at [facility], [address] [place], [place] [address]  Culture MODERATE STAPHYLOCOCCUS AUREUS  Final   Report Status 03/15/2020 FINAL  Final   Organism ID, Bacteria STAPHYLOCOCCUS AUREUS  Final      Susceptibility   Staphylococcus aureus - MIC*    CIPROFLOXACIN >=8 RESISTANT Resistant     ERYTHROMYCIN >=8 RESISTANT Resistant     GENTAMICIN <=0.5 SENSITIVE Sensitive     OXACILLIN <=0.25 SENSITIVE Sensitive     TETRACYCLINE <=1 SENSITIVE Sensitive     VANCOMYCIN 1 SENSITIVE Sensitive     TRIMETH/SULFA <=10 SENSITIVE Sensitive     CLINDAMYCIN <=0.25 SENSITIVE Sensitive     RIFAMPIN <=0.5 SENSITIVE Sensitive     Inducible Clindamycin NEGATIVE Sensitive     * MODERATE STAPHYLOCOCCUS AUREUS     Labs: BNP (last 3 results) No results for input(s): BNP in the last 8760 hours. Basic Metabolic Panel: Recent Labs  Lab 03/13/20 1435 03/15/20 0401 03/16/20 0837  NA 137 140 140  K 3.7 5.1 4.2  CL 102 109 106  CO2 19* 22 21*  GLUCOSE 95 102* 141*  BUN _0 CREATININE 0.55* 0.61 0.53*  CALCIUM 9.3 8.9 8.9   Liver Function Tests: Recent Labs  Lab 03/13/20 1435 03/15/20 0401 03/16/20 0837  AST 22 33 25  ALT _1 ALKPHOS 91 88 79  BILITOT 0.8 1.4* 0.9  PROT 7.8 5.9* 6.2*  ALBUMIN 3.7 3.0* 3.0*   No results for input(s): LIPASE, AMYLASE in the last 168 hours. No results for input(s): AMMONIA in the last 168 hours. CBC: Recent Labs  Lab 03/13/20 1435 03/15/20 0401 03/16/20 0837  WBC 10.1 4.7 5.8  NEUTROABS 7.3  --   --   HGB 14.6 14.0 14.4  HCT 41.9 41.6 42.3  MCV 95.2 97.7 96.8  PLT 242 210 236   Cardiac Enzymes: No results for input(s): CKTOTAL, CKMB, CKMBINDEX, TROPONINI in the last 168 hours. BNP: Invalid input(s): POCBNP CBG: No results for input(s): GLUCAP in the last 168 hours. D-Dimer No results for input(s): DDIMER  in the last 72 hours. Hgb A1c No results for input(s): HGBA1C in the last 72 hours. Lipid Profile No results for input(s): CHOL, HDL, LDLCALC, TRIG, CHOLHDL, LDLDIRECT in the last 72 hours. Thyroid function studies No results for input(s): TSH, T4TOTAL, T3FREE, THYROIDAB in the last 72 hours.  Invalid input(s): FREET3 Anemia work up No results for input(s): VITAMINB12, FOLATE, FERRITIN, TIBC, IRON, RETICCTPCT in the last 72 hours. Urinalysis No results found for: COLORURINE, APPEARANCEUR, [place], [place], [place], [place], [place], [place], PROTEINUR, UROBILINOGEN, NITRITE, LEUKOCYTESUR Sepsis Labs Invalid input(s): PROCALCITONIN,  WBC,  LACTICIDVEN Microbiology Recent Results (from the past 240 hour(s))  Blood culture (routine x 2)     Status: None (Preliminary result)   Collection Time: 03/13/20  2:35 PM   Specimen: BLOOD  Result Value Ref Range Status   Specimen Description BLOOD BLOOD LEFT FOREARM  Final   Special Requests   Final    BOTTLES DRAWN AEROBIC AND ANAEROBIC Blood Culture results may not be optimal due to an inadequate volume of blood received in culture bottles   Culture   Final    NO GROWTH 3 DAYS Performed at [facility], [place] [address], [place], [place] [address]    Report Status PENDING  Incomplete  Blood culture (routine x 2)     Status: None (Preliminary result)   Collection Time: 03/13/20  5:12 PM   Specimen: BLOOD LEFT FOREARM  Result Value Ref Range Status   Specimen Description BLOOD  LEFT FOREARM  Final   Special Requests   Final    BOTTLES DRAWN AEROBIC AND ANAEROBIC Blood Culture adequate volume   Culture   Final    NO GROWTH 3 DAYS Performed at [facility], [address] [place], [place] [address]    Report Status PENDING  Incomplete  SARS Coronavirus 2 by RT PCR (hospital order, performed in [facility] hospital lab) Nasopharyngeal Nasopharyngeal Swab     Status: None   Collection Time: 03/13/20  6:39 PM   Specimen:  Nasopharyngeal Swab  Result Value Ref Range Status   SARS Coronavirus 2 NEGATIVE NEGATIVE Final    Comment: (NOTE) SARS-CoV-2 target nucleic acids are NOT DETECTED. The SARS-CoV-2 RNA is generally detectable in upper and lower respiratory specimens during the acute phase of infection. The lowest concentration of SARS-CoV-2 viral copies this assay can detect is 250 copies / mL. A negative result does not preclude SARS-CoV-2 infection and should not be used as the sole basis for treatment or other patient management decisions.  A negative result may occur with improper specimen collection / handling, submission of specimen other than nasopharyngeal swab, presence of viral mutation(s) within the areas targeted by this assay, and inadequate number of viral copies (<250 copies / mL). A negative result must be combined with clinical observations, patient history, and epidemiological information. Fact Sheet for Patients:   StrictlyIdeas.no Fact Sheet for Healthcare Providers: BankingDealers.co.za This test is not yet approved or cleared  by the [place] FDA and has been authorized for detection and/or diagnosis of SARS-CoV-2 by FDA under an Emergency Use Authorization (EUA).  This EUA will remain in effect (meaning this test can be used) for the duration of the COVID-19 declaration under Section 564(b)(1) of the Act, 21 U.S.C. section 360bbb-3(b)(1), unless the authorization is terminated or revoked sooner. Performed at [facility], [place] [address], [place], [place] [address]   Aerobic Culture (superficial specimen)     Status: None   Collection Time: 03/13/20  8:57 PM   Specimen: Wound  Result Value Ref Range Status   Specimen Description WOUND THUMB RIGHT  Final   Special Requests NONE  Final   Gram Stain   Final    NO WBC SEEN RARE GRAM POSITIVE COCCI Performed at [facility], [address], [place], [place]  [address]    Culture MODERATE STAPHYLOCOCCUS AUREUS  Final   Report Status 03/15/2020 FINAL  Final   Organism ID, Bacteria STAPHYLOCOCCUS AUREUS  Final      Susceptibility   Staphylococcus aureus - MIC*    CIPROFLOXACIN >=8 RESISTANT Resistant     ERYTHROMYCIN >=8 RESISTANT Resistant     GENTAMICIN <=0.5 SENSITIVE Sensitive     OXACILLIN <=0.25 SENSITIVE Sensitive     TETRACYCLINE <=1 SENSITIVE Sensitive     VANCOMYCIN 1 SENSITIVE Sensitive     TRIMETH/SULFA <=10 SENSITIVE Sensitive     CLINDAMYCIN <=0.25 SENSITIVE Sensitive     RIFAMPIN <=0.5 SENSITIVE Sensitive     Inducible Clindamycin NEGATIVE Sensitive     * MODERATE STAPHYLOCOCCUS AUREUS     Time coordinating discharge: Over 30 minutes  SIGNED:   Tell Rozelle J [place] DO  [facility] 03/17/2020, 8:38 AM

## 2020-03-17 NOTE — Plan of Care (Signed)
  Problem: Education: Goal: Knowledge of General Education information will improve Description: Including pain rating scale, medication(s)/side effects and non-pharmacologic comfort measures 03/17/2020 1252 by Lenise Arena, RN Outcome: Adequate for Discharge 03/17/2020 0814 by Lenise Arena, RN Outcome: Progressing   Problem: Health Behavior/Discharge Planning: Goal: Ability to manage health-related needs will improve 03/17/2020 1252 by Lenise Arena, RN Outcome: Adequate for Discharge 03/17/2020 0814 by Lenise Arena, RN Outcome: Progressing   Problem: Clinical Measurements: Goal: Ability to maintain clinical measurements within normal limits will improve 03/17/2020 1252 by Lenise Arena, RN Outcome: Adequate for Discharge 03/17/2020 0814 by Lenise Arena, RN Outcome: Progressing Goal: Will remain free from infection 03/17/2020 1252 by Lenise Arena, RN Outcome: Adequate for Discharge 03/17/2020 0814 by Lenise Arena, RN Outcome: Progressing Goal: Diagnostic test results will improve 03/17/2020 1252 by Lenise Arena, RN Outcome: Adequate for Discharge 03/17/2020 0814 by Lenise Arena, RN Outcome: Progressing Goal: Respiratory complications will improve 03/17/2020 1252 by Lenise Arena, RN Outcome: Adequate for Discharge 03/17/2020 0814 by Lenise Arena, RN Outcome: Progressing Goal: Cardiovascular complication will be avoided 03/17/2020 1252 by Lenise Arena, RN Outcome: Adequate for Discharge 03/17/2020 0814 by Lenise Arena, RN Outcome: Progressing   Problem: Activity: Goal: Risk for activity intolerance will decrease 03/17/2020 1252 by Lenise Arena, RN Outcome: Adequate for Discharge 03/17/2020 [id number] by Lenise Arena, RN Outcome: Progressing   Problem: Nutrition: Goal: Adequate nutrition will be maintained 03/17/2020 1252 by Lenise Arena, RN Outcome: Adequate for Discharge 03/17/2020 0814 by Lenise Arena, RN Outcome: Progressing   Problem: Coping: Goal: Level of anxiety will  decrease 03/17/2020 1252 by Lenise Arena, RN Outcome: Adequate for Discharge 03/17/2020 0814 by Lenise Arena, RN Outcome: Progressing   Problem: Elimination: Goal: Will not experience complications related to bowel motility 03/17/2020 1252 by Lenise Arena, RN Outcome: Adequate for Discharge 03/17/2020 0814 by Lenise Arena, RN Outcome: Progressing Goal: Will not experience complications related to urinary retention 03/17/2020 1252 by Lenise Arena, RN Outcome: Adequate for Discharge 03/17/2020 0814 by Lenise Arena, RN Outcome: Progressing   Problem: Pain Managment: Goal: General experience of comfort will improve 03/17/2020 1252 by Lenise Arena, RN Outcome: Adequate for Discharge 03/17/2020 0814 by Lenise Arena, RN Outcome: Progressing   Problem: Safety: Goal: Ability to remain free from injury will improve 03/17/2020 1252 by Lenise Arena, RN Outcome: Adequate for Discharge 03/17/2020 0814 by Lenise Arena, RN Outcome: Progressing   Problem: Skin Integrity: Goal: Risk for impaired skin integrity will decrease 03/17/2020 1252 by Lenise Arena, RN Outcome: Adequate for Discharge 03/17/2020 0814 by Lenise Arena, RN Outcome: Progressing

## 2020-03-18 LAB — CULTURE, BLOOD (ROUTINE X 2)
Culture: NO GROWTH
Culture: NO GROWTH
Special Requests: ADEQUATE

## 2020-04-01 ENCOUNTER — Telehealth (INDEPENDENT_AMBULATORY_CARE_PROVIDER_SITE_OTHER): Payer: Self-pay | Admitting: Infectious Disease

## 2020-04-01 ENCOUNTER — Other Ambulatory Visit: Payer: Self-pay

## 2020-04-01 DIAGNOSIS — M86241 Subacute osteomyelitis, right hand: Secondary | ICD-10-CM

## 2020-04-02 NOTE — Progress Notes (Signed)
PAtient never picked up call

## 2020-05-11 MED FILL — AMOX-CLAV 875-125 MG TABLET: 875-125 | 20 days supply | Qty: 40 | Fill #0

## 2020-05-21 MED FILL — AMOX-CLAV 875-125 MG TABLET: 875-125 | 20 days supply | Qty: 40 | Fill #0

## 2021-09-27 IMAGING — MR MR [PERSON_NAME]*[PERSON_NAME]* WO/W CM
5 of 10 series · 19 of 40 positions shown · IV contrast (gadavist)
Comparison: Radiographs dated 03/13/2020

CLINICAL DATA: Cellulitis and swelling of the right hand secondary
to a laceration of the tip of the right thumb. Abnormal radiographs
dated 03/13/2020 with possible erosion of the tuft of the distal
phalanx of the thumb.

EXAM:
MRI OF THE RIGHT HAND WITHOUT AND WITH CONTRAST
TECHNIQUE: Multiplanar, multisequence MR imaging of the right hand was
performed before and after the administration of intravenous
contrast.
CONTRAST:  6mL GADAVIST GADOBUTROL 1 MMOL/ML IV SOLN

[Series 5: T1 · axial · 2.5mm · 0.29mm/px · z∈[-119,+35]mm · 5 of 77 slices shown (1 of 3)]
[im 1/77]
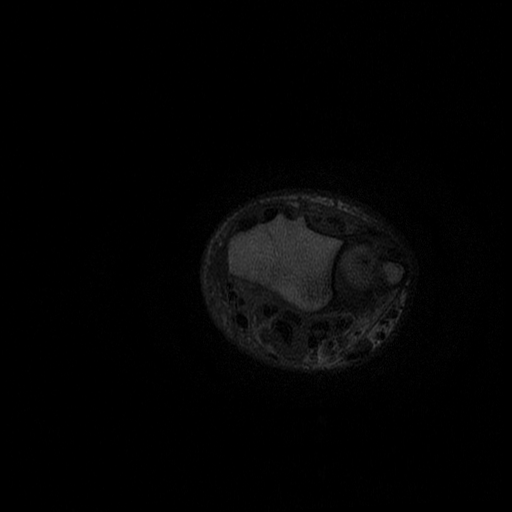
[im 20/77]
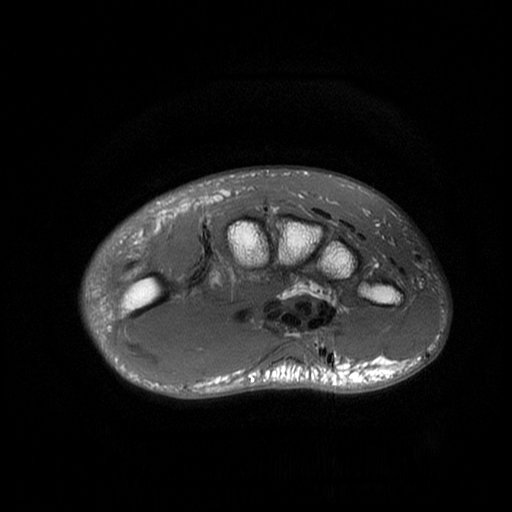
[im 39/77]
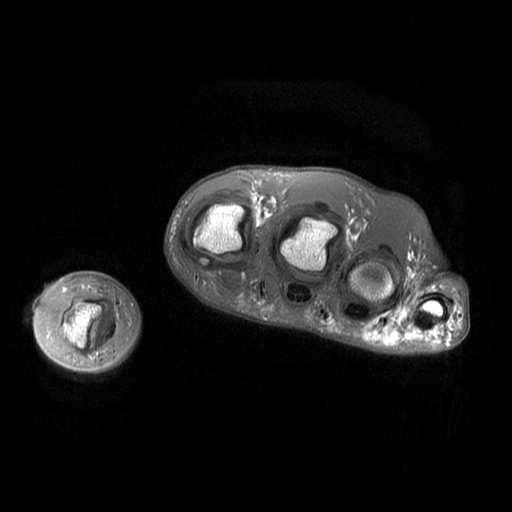
[im 58/77]
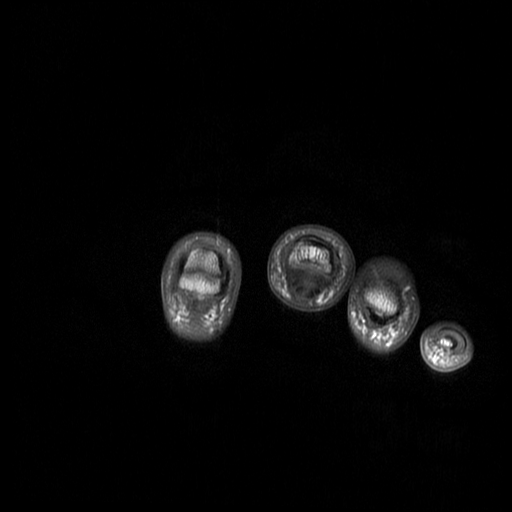
[im 77/77]
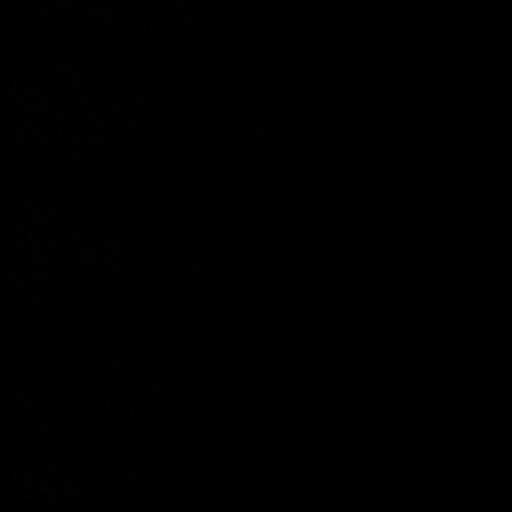

[Series 6: T2 fat-sat · axial · 3.0mm · 0.29mm/px · z∈[-119,+34]mm · 4 of 55 slices shown]
[im 1/55]
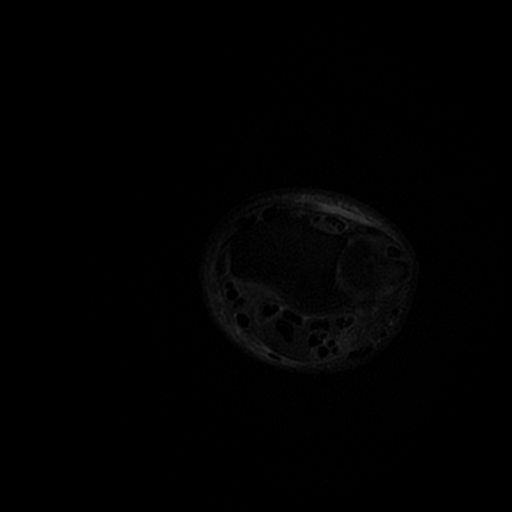
[im 19/55]
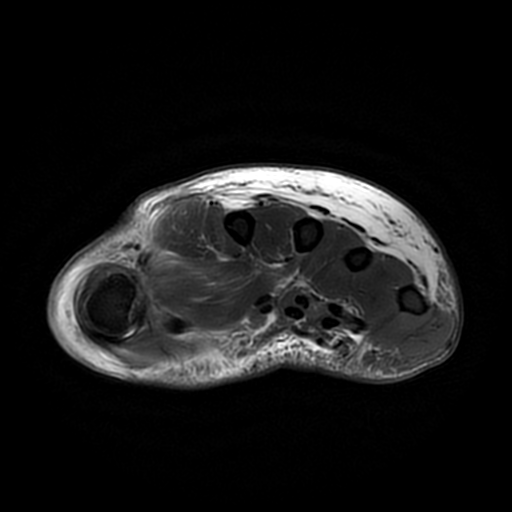
[im 37/55]
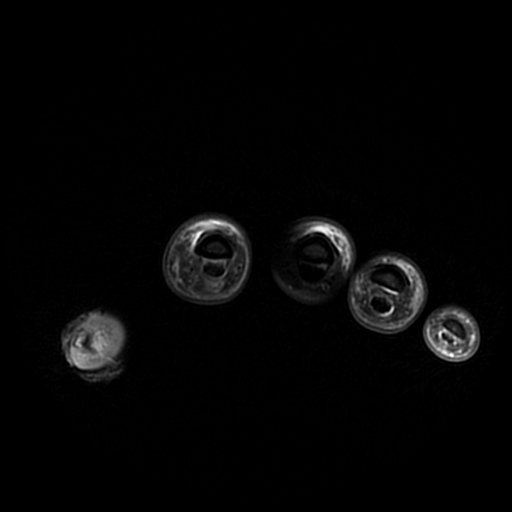
[im 55/55]
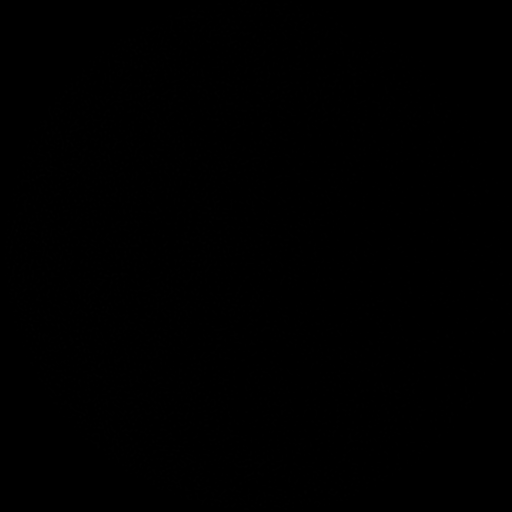

[Series 7: T1 · oblique · 2.0mm · 0.43mm/px · 5 of 74 slices shown (2 of 3)]
[im 1/74]
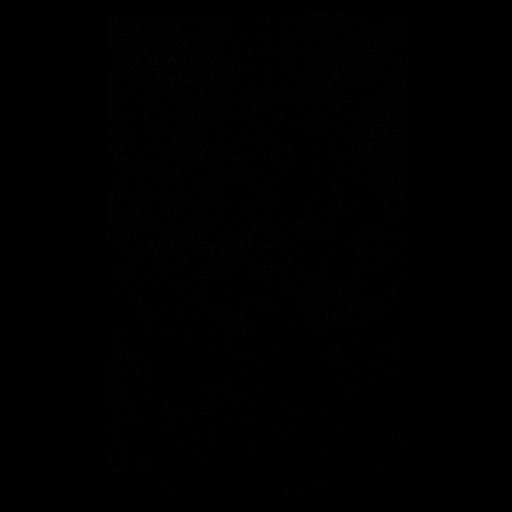
[im 19/74]
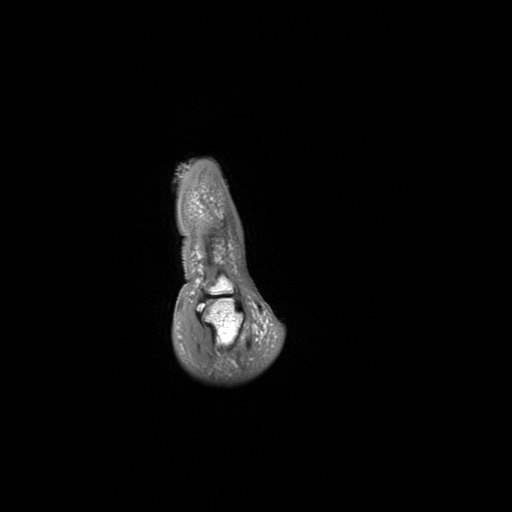
[im 37/74]
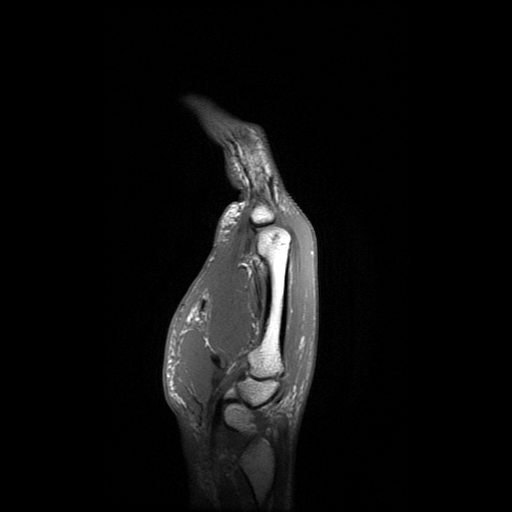
[im 55/74]
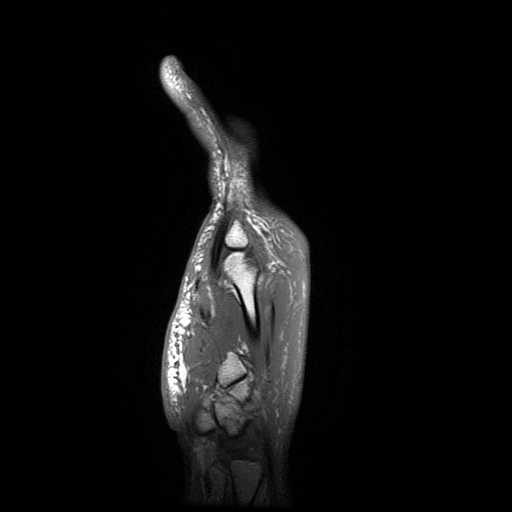
[im 74/74]
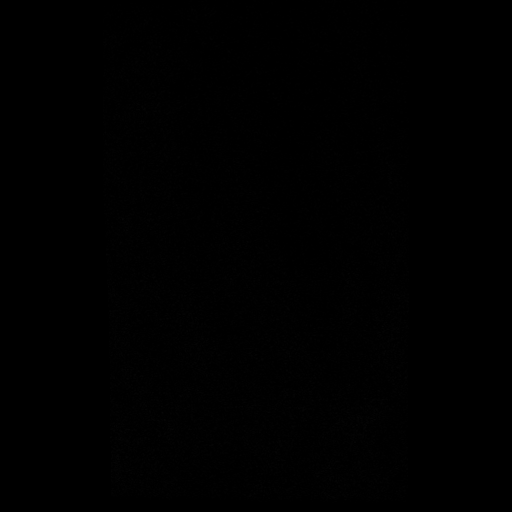

[Series 9: T1 · coronal · 3.0mm · 0.39mm/px · 1 of 27 slices shown (3 of 3)]
[im 1/27]
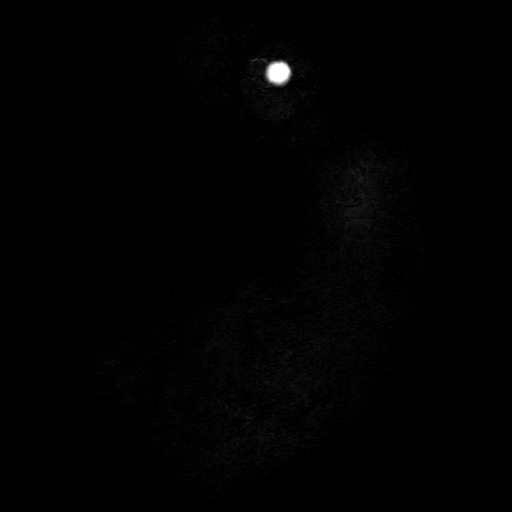

[Series 10: PD fat-sat · oblique · 3.0mm · 0.43mm/px · 4 of 50 slices shown]
[im 1/50]
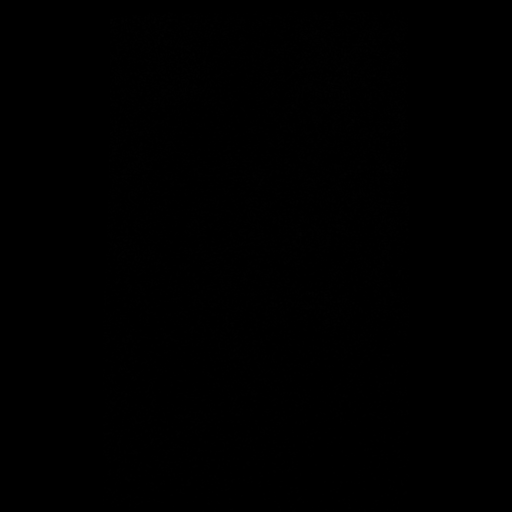
[im 17/50]
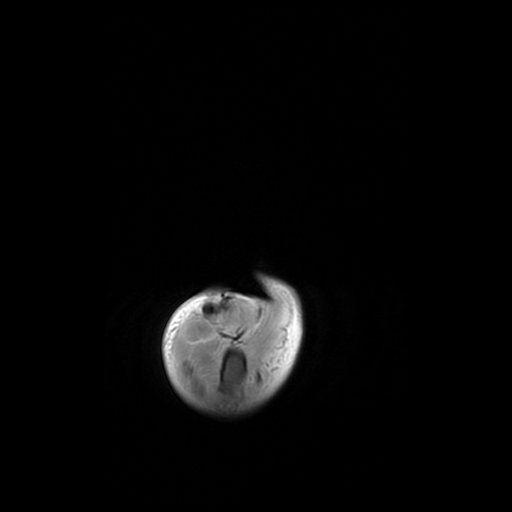
[im 33/50]
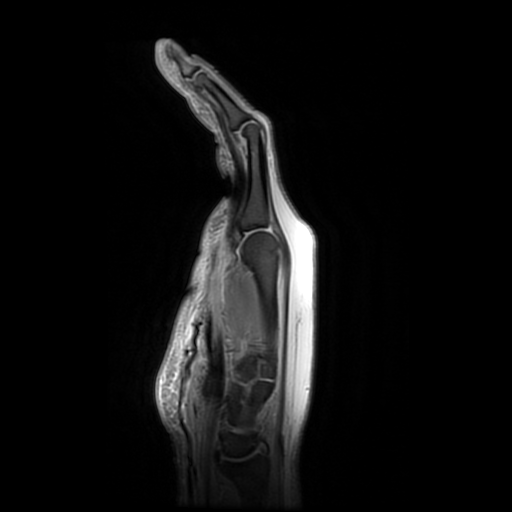
[im 50/50]
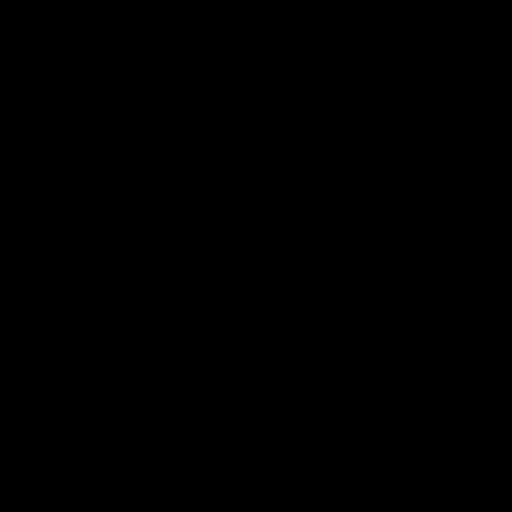

[19 of 40 positions shown; findings below may reference images not displayed]

FINDINGS: Bones/Joint/Cartilage

There is osteomyelitis involving the entire distal phalanx of right
thumb. The other bones of the right hand appear normal. No joint
effusions.

Ligaments

Normal.

Muscles and Tendons

Normal.

Soft tissues

There is diffuse edema of the soft tissues of the hand including
muscles of the thenar eminence. There is a focal area of what
appears to be devitalized soft tissue overlying the dorsal aspect of
the IP joint of the thumb best seen on image 38 of series 15 and
image 21 of series 14. There is loss of soft tissue at the dorsal
aspect of the tip of the thumb.

No definable soft tissue abscesses.
IMPRESSION: 1. Osteomyelitis involving the entire distal phalanx of the right
thumb.
2. Focal area of devitalized soft tissue overlying the dorsal aspect
of the IP joint of the thumb.
3. Diffuse edema of the soft tissues of the hand including muscles
of thenar eminence consistent with cellulitis and myositis. No
discrete soft tissue abscesses.

## 2021-10-15 ENCOUNTER — Other Ambulatory Visit (HOSPITAL_BASED_OUTPATIENT_CLINIC_OR_DEPARTMENT_OTHER): Payer: Self-pay

## 2021-10-15 MED ORDER — BUPRENORPHINE HCL-NALOXONE HCL 8-2 MG SL FILM
ORAL_FILM | SUBLINGUAL | 0 refills | Status: DC
  Filled 2021-10-15: qty 4, 3d supply, fill #0
  Filled 2021-10-15: qty 7, 5d supply, fill #0
  Filled 2021-10-28: qty 4, 3d supply, fill #1

## 2021-10-18 ENCOUNTER — Other Ambulatory Visit (HOSPITAL_BASED_OUTPATIENT_CLINIC_OR_DEPARTMENT_OTHER): Payer: Self-pay

## 2021-10-27 ENCOUNTER — Other Ambulatory Visit (HOSPITAL_BASED_OUTPATIENT_CLINIC_OR_DEPARTMENT_OTHER): Payer: Self-pay

## 2021-10-28 ENCOUNTER — Other Ambulatory Visit (HOSPITAL_BASED_OUTPATIENT_CLINIC_OR_DEPARTMENT_OTHER): Payer: Self-pay

## 2022-02-10 ENCOUNTER — Ambulatory Visit
Admission: EM | Admit: 2022-02-10 | Discharge: 2022-02-10 | Disposition: A | Discharge status: Home / Self Care | Payer: Self-pay | Attending: Family Medicine | Admitting: Family Medicine

## 2022-02-10 ENCOUNTER — Encounter: Payer: Self-pay | Admitting: Emergency Medicine

## 2022-02-10 ENCOUNTER — Other Ambulatory Visit: Payer: Self-pay

## 2022-02-10 DIAGNOSIS — S61011A Laceration without foreign body of right thumb without damage to nail, initial encounter: Secondary | ICD-10-CM

## 2022-02-10 NOTE — ED Triage Notes (Signed)
Pt reports laceration to right thumb on grill scraper this afternoon. Bleeding controlled.  ? ?Pt currently soaking site in Hibiclens and sterile water.  ?

## 2022-02-10 NOTE — ED Notes (Signed)
Site dressed with non-adherent pad and secured with coban.pt tolerated well. ? ?Site management and infection prevention education provided.  ?

## 2022-02-15 NOTE — ED Provider Notes (Signed)
?  [facility] ? ? ?[id number] ?02/10/22 Arrival Time: 1948 ? ?ASSESSMENT & PLAN: ? ?1. Laceration of right thumb without foreign body without damage to nail, initial encounter   ? ? ?Procedure: ?Verbal consent obtained. Patient provided with risks and alternatives to the procedure. Wound copiously irrigated with NS then cleansed with betadine. Local anesthesia: Lidocaine 1% without epinephrine. Wound carefully explored. No foreign body, tendon injury, or nonviable tissue were noted. Using clean technique, 4 interrupted 4-0 Ethilon sutures were placed to reapproximate the wound. Procedure tolerated well. No complications. Minimal bleeding. Advised to look for and return for any signs of infection such as redness, swelling, discharge, or worsening pain. Return for suture removal in 7 days. ? ? ?Reviewed expectations re: course of current medical issues. Questions answered. ?Outlined signs and symptoms indicating need for more acute intervention. ?Patient verbalized understanding. ?After Visit Summary given. ? ? ?SUBJECTIVE: ? ?Tanner Hoffman is a 31 y.o. male who presents with a laceration of his R thumb; secondary to sharp metal; today; bleeding controlled; no ROM loss. No extremity sensation changes or weakness.  ? ?Td UTD: Yes. ? ?Health Maintenance Due  ?Topic Date Due  ? COVID-19 Vaccine (1) Never done  ? ? ?OBJECTIVE: ? ?Vitals:  ? 02/10/22 1954 02/10/22 1956  ?BP: 105/71   ?Pulse: 91   ?Resp: 18   ?Temp: 98.3 ?F (36.8 ?C)   ?TempSrc: Oral   ?SpO2: 96%   ?Weight:  59 kg  ?Height:  5\' 10"  (1.778 m)  ?  ? ?General appearance: alert; no distress ?RUE: linear laceration of R lateral thumb; size: approx 1.5 cm; clean wound edges, no foreign bodies; with active bleeding; normal ROM; normal cap refill and distal sensation ?Psychological: alert and cooperative; normal mood and affect ? ? ? ?No Known Allergies ? ?Past Medical History:  ?Diagnosis Date  ? Abscess of left foot   ? tx'ed in 2020  ? Drug abuse,  opioid type (HCC)   ? IV drug abuse (HCC)   ? ?Social History  ? ?Socioeconomic History  ? Marital status: Single  ?  Spouse name: Not on file  ? Number of children: Not on file  ? Years of education: Not on file  ? Highest education level: Not on file  ?Occupational History  ? Not on file  ?Tobacco Use  ? Smoking status: Every Day  ?  Packs/day: 1.00  ?  Years: 15.00  ?  Pack years: 15.00  ?  Types: Cigarettes  ? Smokeless tobacco: Never  ?Substance and Sexual Activity  ? Alcohol use: Yes  ?  Alcohol/week: 10.0 standard drinks  ?  Types: 10 Shots of liquor per week  ?  Comment: 10 shots/day  ? Drug use: Yes  ?  Types: IV, Methamphetamines, Heroin, MDMA (Ecstacy)  ?  Comment: injecting suboxone from street  ? Sexual activity: Yes  ?  Partners: Female  ?  Birth control/protection: None  ?Other Topics Concern  ? Not on file  ?Social History Narrative  ? Not on file  ? ?Social Determinants of Health  ? ?Financial Resource Strain: Not on file  ?Food Insecurity: Not on file  ?Transportation Needs: Not on file  ?Physical Activity: Not on file  ?Stress: Not on file  ?Social Connections: Not on file  ? ? ? ? ? ? ?  ?2021, MD ?02/15/22 [phone] ? ?

## 2023-05-01 ENCOUNTER — Ambulatory Visit: Payer: Medicaid Other | Admitting: Nurse Practitioner

## 2023-05-15 ENCOUNTER — Encounter: Payer: Self-pay | Admitting: Nurse Practitioner

## 2023-05-15 ENCOUNTER — Ambulatory Visit: Payer: Medicaid Other | Admitting: Nurse Practitioner

## 2023-05-15 VITALS — BP 129/67 | HR 71 | Temp 98.1°F | Ht 70.0 in | Wt 144.0 lb

## 2023-05-15 DIAGNOSIS — Z716 Tobacco abuse counseling: Secondary | ICD-10-CM

## 2023-05-15 DIAGNOSIS — Z720 Tobacco use: Secondary | ICD-10-CM | POA: Diagnosis not present

## 2023-05-15 DIAGNOSIS — Z0000 Encounter for general adult medical examination without abnormal findings: Secondary | ICD-10-CM

## 2023-05-15 DIAGNOSIS — L732 Hidradenitis suppurativa: Secondary | ICD-10-CM

## 2023-05-15 DIAGNOSIS — F331 Major depressive disorder, recurrent, moderate: Secondary | ICD-10-CM

## 2023-05-15 DIAGNOSIS — F1910 Other psychoactive substance abuse, uncomplicated: Secondary | ICD-10-CM

## 2023-05-15 DIAGNOSIS — B182 Chronic viral hepatitis C: Secondary | ICD-10-CM | POA: Diagnosis not present

## 2023-05-15 LAB — CMP14+EGFR
AST: 85 IU/L — ABNORMAL HIGH (ref 0–40)
Albumin: 5 g/dL (ref 4.1–5.1)
Alkaline Phosphatase: 147 IU/L — ABNORMAL HIGH (ref 44–121)
BUN/Creatinine Ratio: 16 (ref 9–20)
BUN: 14 mg/dL (ref 6–20)
Bilirubin Total: 0.5 mg/dL (ref 0.0–1.2)
CO2: 20 mmol/L (ref 20–29)
Calcium: 9.5 mg/dL (ref 8.7–10.2)
Chloride: 97 mmol/L (ref 96–106)
Creatinine, Ser: 0.89 mg/dL (ref 0.76–1.27)
Globulin, Total: 2.2 g/dL (ref 1.5–4.5)
Glucose: 114 mg/dL — ABNORMAL HIGH (ref 70–99)
Potassium: 3.4 mmol/L — ABNORMAL LOW (ref 3.5–5.2)
Sodium: 138 mmol/L (ref 134–144)
Total Protein: 7.2 g/dL (ref 6.0–8.5)
eGFR: 115 mL/min/{1.73_m2} (ref >59)

## 2023-05-15 LAB — CBC WITH DIFFERENTIAL/PLATELET
Basophils Absolute: 0.1 10*3/uL (ref 0.0–0.2)
Basos: 1 %
EOS (ABSOLUTE): 0.2 10*3/uL (ref 0.0–0.4)
Eos: 2 %
Hematocrit: 42.2 % (ref 37.5–51.0)
Hemoglobin: 14.7 g/dL (ref 13.0–17.7)
Immature Grans (Abs): 0 10*3/uL (ref 0.0–0.1)
Immature Granulocytes: 0 %
Lymphocytes Absolute: 2.2 10*3/uL (ref 0.7–3.1)
Lymphs: 24 %
MCH: 34.2 pg — ABNORMAL HIGH (ref 26.6–33.0)
MCHC: 34.8 g/dL (ref 31.5–35.7)
MCV: 98 fL — ABNORMAL HIGH (ref 79–97)
Monocytes Absolute: 0.7 10*3/uL (ref 0.1–0.9)
Monocytes: 8 %
Neutrophils Absolute: 6.1 10*3/uL (ref 1.4–7.0)
Neutrophils: 65 %
Platelets: 203 10*3/uL (ref 150–450)
RBC: 4.3 x10E6/uL (ref 4.14–5.80)
RDW: 12.6 % (ref 11.6–15.4)
WBC: 9.2 10*3/uL (ref 3.4–10.8)

## 2023-05-15 LAB — THYROID PANEL WITH TSH

## 2023-05-15 LAB — LIPID PANEL

## 2023-05-15 LAB — BAYER DCA HB A1C WAIVED: HB A1C (BAYER DCA - WAIVED): 5.2 % (ref 4.8–5.6)

## 2023-05-15 LAB — HEPATIC FUNCTION PANEL

## 2023-05-15 MED ORDER — ESCITALOPRAM OXALATE 10 MG PO TABS: 10.0000 mg | ORAL_TABLET | Freq: Every day | ORAL | 1 refills | Status: DC
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
## 2023-05-16 ENCOUNTER — Other Ambulatory Visit: Payer: Self-pay | Admitting: Nurse Practitioner

## 2023-05-16 DIAGNOSIS — E782 Mixed hyperlipidemia: Secondary | ICD-10-CM

## 2023-05-16 MED ORDER — ROSUVASTATIN CALCIUM 10 MG PO TABS: 10.0000 mg | ORAL_TABLET | Freq: Every day | ORAL | 0 refills | Status: DC

## 2023-05-16 NOTE — Progress Notes (Signed)
Patient r/c  

## 2023-05-17 ENCOUNTER — Telehealth: Payer: Self-pay | Admitting: Nurse Practitioner

## 2023-05-17 ENCOUNTER — Other Ambulatory Visit: Payer: Self-pay

## 2023-05-17 MED ORDER — ROSUVASTATIN CALCIUM 10 MG PO TABS: 10.0000 mg | ORAL_TABLET | Freq: Every day | ORAL | 0 refills | Status: DC

## 2023-05-17 NOTE — Telephone Encounter (Signed)
Sent prescription to correct pharmacy. Pt aware and verbalized understanding.

## 2023-05-18 ENCOUNTER — Other Ambulatory Visit: Payer: Self-pay | Admitting: Nurse Practitioner

## 2023-05-18 ENCOUNTER — Telehealth: Payer: Self-pay | Admitting: Nurse Practitioner

## 2023-05-18 ENCOUNTER — Other Ambulatory Visit (HOSPITAL_COMMUNITY): Payer: Self-pay

## 2023-05-18 DIAGNOSIS — L732 Hidradenitis suppurativa: Secondary | ICD-10-CM

## 2023-05-18 MED ORDER — CEPHALEXIN 500 MG PO CAPS
500.0000 mg | ORAL_CAPSULE | Freq: Two times a day (BID) | ORAL | 0 refills | Status: DC
Start: 2023-05-18 — End: 2023-05-19
  Filled 2023-05-18: qty 14, 7d supply, fill #0

## 2023-05-18 NOTE — Telephone Encounter (Signed)
Pt called to let PCP know that Dr Chipper Herb office called him and had to r/s his appt for 8/24 so he has not been able to get an antibiotic for his arm, that was discussed at his visit with PCP on 7/29. Wants to know if PCP can send antibiotic in for him? Pt uses Psychologist, [facility] in [place].

## 2023-05-18 NOTE — Telephone Encounter (Signed)
Left detailed message (dpr)

## 2023-05-19 ENCOUNTER — Other Ambulatory Visit: Payer: Self-pay | Admitting: *Deleted

## 2023-05-19 DIAGNOSIS — L732 Hidradenitis suppurativa: Secondary | ICD-10-CM

## 2023-05-19 MED ORDER — CEPHALEXIN 500 MG PO CAPS
500.0000 mg | ORAL_CAPSULE | Freq: Two times a day (BID) | ORAL | 0 refills | Status: DC
Start: 2023-05-19 — End: 2023-06-20

## 2023-05-19 NOTE — Telephone Encounter (Signed)
Please send rx to [facility]

## 2023-05-19 NOTE — Telephone Encounter (Signed)
Rx resent to [facility]

## 2023-06-15 ENCOUNTER — Ambulatory Visit: Payer: Medicaid Other | Admitting: Nurse Practitioner
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
## 2023-06-20 ENCOUNTER — Encounter: Payer: Self-pay | Admitting: Nurse Practitioner

## 2023-06-20 ENCOUNTER — Ambulatory Visit: Payer: Medicaid Other | Admitting: Nurse Practitioner

## 2023-06-20 VITALS — BP 96/62 | HR 88 | Temp 98.2°F | Ht 70.0 in | Wt 144.6 lb

## 2023-06-20 DIAGNOSIS — F331 Major depressive disorder, recurrent, moderate: Secondary | ICD-10-CM

## 2023-06-20 DIAGNOSIS — E782 Mixed hyperlipidemia: Secondary | ICD-10-CM | POA: Diagnosis not present

## 2023-06-20 DIAGNOSIS — F1910 Other psychoactive substance abuse, uncomplicated: Secondary | ICD-10-CM

## 2023-06-20 MED ORDER — BUSPIRONE HCL 5 MG PO TABS
5.0000 mg | ORAL_TABLET | Freq: Two times a day (BID) | ORAL | 0 refills | Status: AC
Start: 2023-06-20 — End: ?

## 2023-06-20 MED ORDER — ESCITALOPRAM OXALATE 10 MG PO TABS
10.0000 mg | ORAL_TABLET | Freq: Every day | ORAL | 0 refills | Status: AC
Start: 2023-06-20 — End: ?

## 2023-06-21 MED ORDER — ROSUVASTATIN CALCIUM 10 MG PO TABS
10.0000 mg | ORAL_TABLET | Freq: Every day | ORAL | 0 refills | Status: DC
Start: 2023-06-21 — End: 2023-08-24

## 2023-07-05 NOTE — H&P (Signed)
   Patient: Tanner Hoffman  PID: [id number]  DOB: Oct 21, 1990  SEX: Male   Patient referred by DDS for extraction teeth # 6, 7, 8, 9, 10, 18, 19, 30, 31.  CC: Painful teeth  Past Medical History:  Hepatitis C, Drug Abuse/Alcohol Abuse, Dental anxiety   Medications: Suboxone, Naproxen    Allergies:     NKDA    Surgeries:   Oral Surgery         Social History       Smoking: 1 ppd           Alcohol: yes Drug use:                             Exam: BMI 20. Caries teeth # 6, 7, 8, 9, 10, 18, 19, 30, 31.   No purulence, edema, fluctuance, trismus. Oral cancer screening negative. Pharynx clear. Mallampati 1. No lymphadenopathy.  Panorex: Caries teeth #6, 7, 8, 9, 10, 18, 19, 30, 31.    Assessment:  ASA  2. Non-restorable teeth# 6, 7, 8, 9, 10, 18, 19, 30, 31.               Plan: Extraction Teeth # 6, 7, 8, 9, 10, 18, 19, 30, 31.    IV Sedation.                Rx: none             Risks and complications explained. Questions answered.   Georgia Lopes, DMD

## 2023-07-10 ENCOUNTER — Encounter (HOSPITAL_COMMUNITY): Payer: Self-pay | Admitting: Physician Assistant

## 2023-07-10 ENCOUNTER — Encounter (HOSPITAL_COMMUNITY): Payer: Self-pay | Admitting: Oral Surgery

## 2023-07-10 ENCOUNTER — Other Ambulatory Visit: Payer: Self-pay

## 2023-07-10 NOTE — Progress Notes (Incomplete)
SDW CALL  Patient was given pre-op instructions over the phone. The opportunity was given for the patient to ask questions. No further questions asked. Patient verbalized understanding of instructions given.   PCP -  Cardiologist -   PPM/ICD -  Device Orders -  Rep Notified -   Chest x-ray -  EKG -  Stress Test -  ECHO -  Cardiac Cath -   Sleep Study -  CPAP -   Fasting Blood Sugar -  Checks Blood Sugar _____ times a day  Blood Thinner Instructions: Aspirin Instructions:  ERAS Protcol - PRE-SURGERY Ensure or G2-   COVID TEST-    Anesthesia review: yes- recent bronchitis   Patient denies shortness of breath, fever, cough and chest pain over the phone call. He states he was prescribed Augmentin for bronchitis. He was having sore throat and a much worse cough than his usual smoker's cough. He is a daily smoker. Augmentin was prescribed on 9/16 for 10 days. Notified anesthesia PA,James Burns. Per Fayrene Fearing, patient's procedure will have to rescheduled b    Surgical Instructions    Your procedure is scheduled on September 24  Report to [facility] Main Entrance "A" at 0630 A.M., then check in with the Admitting office.  Call this number if you have problems the morning of surgery:  [phone]    Remember:  Do not eat or drink anything after midnight the night before your surgery    Take these medicines the morning of surgery with A SIP OF WATER: none. Pt states he takes his meds later in the day and will wait to take them after surgery.   As of today, STOP taking any Aspirin (unless otherwise instructed by your surgeon) Aleve, Naproxen, Ibuprofen, Motrin, Advil, Goody's, BC's, all herbal medications, fish oil, and all vitamins.   is not responsible for any belongings or valuables. .   Do NOT Smoke (Tobacco/Vaping)  24 hours prior to your procedure  If you use a CPAP at night, you may bring your mask for your overnight stay.   Contacts, glasses, hearing  aids, dentures or partials may not be worn into surgery, please bring cases for these belongings   Patients discharged the day of surgery will not be allowed to drive home, and someone needs to stay with them for 24 hours.   Special instructions:    Oral Hygiene is also important to reduce your risk of infection.  Remember - BRUSH YOUR TEETH THE MORNING OF SURGERY WITH YOUR REGULAR TOOTHPASTE   Day of Surgery:  Take a shower the day of or night before with antibacterial soap. Wear Clean/Comfortable clothing the morning of surgery Do not apply any deodorants/lotions.   Do not wear jewelry or makeup Do not wear lotions, powders, perfumes/colognes, or deodorant. Do not shave 48 hours prior to surgery.  Men may shave face and neck. Do not bring valuables to the hospital. Do not wear nail polish, gel polish, artificial nails, or any other type of covering on natural nails (fingers and toes) If you have artificial nails or gel coating that need to be removed by a nail salon, please have this removed prior to surgery. Artificial nails or gel coating may interfere with anesthesia's ability to adequately monitor your vital signs. Remember to brush your teeth WITH YOUR REGULAR TOOTHPASTE.

## 2023-07-11 ENCOUNTER — Ambulatory Visit (HOSPITAL_COMMUNITY): Admission: RE | Admit: 2023-07-11 | Payer: Medicaid Other | Source: Home / Self Care | Admitting: Oral Surgery

## 2023-07-11 HISTORY — DX: Inflammatory liver disease, unspecified: K75.9

## 2023-07-11 SURGERY — DENTAL RESTORATION/EXTRACTIONS
Anesthesia: General

## 2023-07-27 ENCOUNTER — Encounter: Payer: Self-pay | Admitting: *Deleted

## 2023-08-22 ENCOUNTER — Ambulatory Visit: Payer: Medicaid Other | Admitting: Nurse Practitioner

## 2023-08-24 ENCOUNTER — Encounter: Payer: Self-pay | Admitting: Nurse Practitioner

## 2023-08-24 ENCOUNTER — Ambulatory Visit: Payer: Medicaid Other | Admitting: Nurse Practitioner

## 2023-08-24 VITALS — BP 119/70 | HR 103 | Temp 97.6°F | Ht 70.0 in | Wt 142.8 lb

## 2023-08-24 DIAGNOSIS — F331 Major depressive disorder, recurrent, moderate: Secondary | ICD-10-CM | POA: Insufficient documentation

## 2023-08-24 DIAGNOSIS — J0110 Acute frontal sinusitis, unspecified: Secondary | ICD-10-CM

## 2023-08-24 DIAGNOSIS — E782 Mixed hyperlipidemia: Secondary | ICD-10-CM

## 2023-08-24 MED ORDER — FLUTICASONE PROPIONATE 50 MCG/ACT NA SUSP
2.0000 | Freq: Every day | NASAL | 6 refills | Status: AC
Start: 2023-08-24 — End: ?

## 2023-08-24 MED ORDER — AZITHROMYCIN 250 MG PO TABS
ORAL_TABLET | ORAL | 0 refills | Status: AC
Start: 2023-08-24 — End: ?

## 2023-08-24 MED ORDER — ROSUVASTATIN CALCIUM 10 MG PO TABS: 10.0000 mg | ORAL_TABLET | Freq: Every day | ORAL | 0 refills | Status: AC
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
## 2023-10-23 ENCOUNTER — Ambulatory Visit: Payer: Medicaid Other | Admitting: Nurse Practitioner

## 2023-11-27 ENCOUNTER — Ambulatory Visit: Payer: Medicaid Other | Admitting: Nurse Practitioner
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
## 2023-11-28 ENCOUNTER — Ambulatory Visit: Payer: Medicaid Other | Admitting: Nurse Practitioner

## 2023-11-28 ENCOUNTER — Encounter: Payer: Self-pay | Admitting: Nurse Practitioner
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
## 2024-10-17 ENCOUNTER — Encounter (HOSPITAL_COMMUNITY): Payer: Self-pay

## 2024-10-17 ENCOUNTER — Emergency Department (HOSPITAL_COMMUNITY): Payer: Self-pay

## 2024-10-17 ENCOUNTER — Other Ambulatory Visit: Payer: Self-pay

## 2024-10-17 ENCOUNTER — Emergency Department (HOSPITAL_COMMUNITY)
Admission: EM | Admit: 2024-10-17 | Discharge: 2024-10-17 | Disposition: A | Discharge status: Home / Self Care | Payer: Self-pay | Attending: Emergency Medicine | Admitting: Emergency Medicine

## 2024-10-17 DIAGNOSIS — M25562 Pain in left knee: Secondary | ICD-10-CM | POA: Insufficient documentation

## 2024-10-17 MED ORDER — MELOXICAM 7.5 MG PO TABS: 7.5000 mg | ORAL_TABLET | Freq: Every day | ORAL | 0 refills | Status: AC

## 2024-10-17 NOTE — Discharge Instructions (Addendum)
 You are seen in the emergency room today for left knee pain with a feeling of the knee giving out after twisting your knee recently.  Fortunately your x-ray did not show any broken bones, there is no fluid collection, and no sign of a completely torn tendon.  We have given you a brace for support, Mobic for pain and referring to orthopedics for follow-up.  Come back to the ER for new or worsening symptoms.

## 2024-10-17 NOTE — ED Provider Notes (Signed)
 " [facility] AT [facility] Provider Note   CSN: [id number] Arrival date & time: 10/17/24  1213     Patient presents with: Knee Injury   Tanner Hoffman is a 34 y.o. male.  He has history of polysubstance abuse, chronic hepatitis C, hyperlipidemia.  Presents to ER for left knee pain just above the left patella that started about a week ago.  He states he had stepped in a hole accidentally twisting the ankle and knee has been having some pain since then.  Has been able to walk but states when he lifts his leg up and tries to bend it he has pain above his patella.  He states when he walks he sometimes feels like it is giving out.  Denies any recent injuries aside from the twisting 1 week ago.  He had no direct trauma, no fevers or chills, no redness or warmth.  He does not have limited range of motion, but states it is painful to walk, he is worried about the knee giving out when he works.   HPI     Prior to Admission medications  Medication Sig Start Date End Date Taking? Authorizing Provider  acetaminophen  (TYLENOL ) 500 MG tablet Take 1,000 mg by mouth every 6 (six) hours as needed for moderate pain or fever.    [provider]  azithromycin  (ZITHROMAX  Z-PAK) 250 MG tablet Takes 2-tabs on day 1 and take 1-tab  daily until done 08/24/23   Deitra Morton Sebastian Nena, NP  buprenorphine -naloxone  (SUBOXONE ) 8-2 mg SUBL SL tablet Place 1 tablet under the tongue 2 (two) times daily. 06/26/23   [provider]  busPIRone  (BUSPAR ) 5 MG tablet Take 1 tablet (5 mg total) by mouth 2 (two) times daily. 06/20/23   St Morton Sebastian Nena, NP  DENTA 5000 PLUS 1.1 % CREA dental cream Take 1 Application by mouth 2 (two) times a week. 06/23/23   [provider]  DM-APAP-CPM (CORICIDIN HBP MAX STRENGTH FLU) 10-325-2 MG TABS Take 2 tablets by mouth 2 (two) times daily as needed (cold symptoms).    [provider]  escitalopram  (LEXAPRO ) 10 MG tablet  Take 1 tablet (10 mg total) by mouth daily. 06/20/23   [facility] Morton Sebastian Nena, NP  fluticasone  (FLONASE ) 50 MCG/ACT nasal spray Place 2 sprays into both nostrils daily. 08/24/23   St Morton Sebastian Nena, NP  naloxone  (NARCAN ) nasal spray 4 mg/0.1 mL Place 1 spray into the nose as needed (opioid overdose). 06/23/23   [provider]  naproxen  sodium (ALEVE ) 220 MG tablet Take 220-440 mg by mouth daily as needed (pain).    [provider]  rosuvastatin  (CRESTOR ) 10 MG tablet Take 1 tablet (10 mg total) by mouth daily. 08/24/23   St Morton Sebastian Nena, NP    Allergies: Patient has no known allergies.    Review of Systems  Updated Vital Signs BP 117/83 (BP Location: Right Arm)   Pulse 89   Temp 98.1 F (36.7 C)   Resp 17   Ht 5' 10 (1.778 m)   Wt 64.8 kg   SpO2 95%   BMI 20.50 kg/m   Physical Exam Vitals and nursing note reviewed.  Constitutional:      General: He is not in acute distress.    Appearance: He is well-developed.  HENT:     Head: Normocephalic and atraumatic.     Mouth/Throat:     Mouth: Mucous membranes are moist.  Eyes:  Conjunctiva/sclera: Conjunctivae normal.  Cardiovascular:     Rate and Rhythm: Normal rate and regular rhythm.     Heart sounds: No murmur heard. Pulmonary:     Effort: Pulmonary effort is normal. No respiratory distress.     Breath sounds: Normal breath sounds.  Abdominal:     Palpations: Abdomen is soft.     Tenderness: There is no abdominal tenderness.  Musculoskeletal:        General: No swelling.     Cervical back: Neck supple.     Comments: Left knee has no swelling, no effusion, no redness or warmth, patient has full active range of motion, negative anterior and posterior drawer test, no ligamentous laxity on the lateral or lateral stress test.  Tenderness is noted over the quadriceps tendon with no defect palpated, patient can fully extend the leg and lift it off the bed and can also fully flex the knee  without difficulty.  DP and PT also left foot are intact. No Calf tenderness.  Skin:    General: Skin is warm and dry.     Capillary Refill: Capillary refill takes less than 2 seconds.  Neurological:     General: No focal deficit present.     Mental Status: He is alert and oriented to person, place, and time.     Sensory: No sensory deficit.     Motor: No weakness.  Psychiatric:        Mood and Affect: Mood normal.     (all labs ordered are listed, but only abnormal results are displayed) Labs Reviewed - No data to display  EKG: None  Radiology: DG Knee Complete 4 Views Left Result Date: 10/17/2024 CLINICAL DATA:  Left knee pain around patella.  Fall/injury. EXAM: LEFT KNEE - COMPLETE 4+ VIEW COMPARISON:  09/03/2014, 01/01/2017. FINDINGS: No acute fracture or dislocation is seen. Joint space is maintained. A mixed lytic and sclerotic lesion is noted in the distal femoral diaphysis, more pronounced as compared from 2015, possibly representing bone infarct or enchondroma. No periosteal elevation or cortical irregularity is seen. There is no joint effusion. Soft tissue swelling is present anterior to the patella. IMPRESSION: No acute fracture or dislocation. Electronically Signed   By: Leita Birmingham M.D.   On: 10/17/2024 13:17     Procedures   Medications Ordered in the ED - No data to display                                  Medical Decision Making Differential diagnose includes but limited to sprain, strain, contusion, fracture, other  Course: Patient presents ER with left knee pain just above patella after a twisting injury about a week ago, suspect strain to the quadriceps tendon, no sign of a quadriceps tear on exam, x-ray shows no fracture or dislocation.  I agree with radiology read, there is noted slitlike and cirrhotic lesion in the distal femoral diaphysis that radiology notes is more pronounced as compared from 2015 possibly a bone infarct or endochondroma, though they do  not recommend any further follow-up.   patient advised on orthopedic follow-up for his knee pain and strict return precautions.  Amount and/or Complexity of Data Reviewed Radiology: ordered and independent interpretation performed. Decision-making details documented in ED Course.  Risk Prescription drug management.        Final diagnoses:  None    ED Discharge Orders     None  Suellen Sherran DELENA DEVONNA 10/17/24 1346    Yolande Lamar BROCKS, MD 10/23/24 2034  "

## 2024-10-17 NOTE — ED Triage Notes (Signed)
 Pt arrived via POV c/o left knee injury X 1 week ago. Pt reports he was stepping out of his vehicle when he accidentally stepped in a hole and twisted his knee. Pt reports limited mobility and pain with ambulation.
